# Patient Record
Sex: Female | Born: 1965 | Hispanic: Yes | Marital: Married | Smoking: Never smoker
Health system: Southern US, Community
[De-identification: ages and names within clinical notes are randomized; demographics above are authoritative.]

## PROBLEM LIST (undated history)

## (undated) DIAGNOSIS — E079 Disorder of thyroid, unspecified: Secondary | ICD-10-CM

## (undated) DIAGNOSIS — E7800 Pure hypercholesterolemia, unspecified: Secondary | ICD-10-CM

## (undated) HISTORY — DX: Pure hypercholesterolemia, unspecified: E78.00

## (undated) HISTORY — DX: Disorder of thyroid, unspecified: E07.9

## (undated) HISTORY — PX: CARDIAC VALVE REPLACEMENT: SHX585

---

## 2018-08-15 ENCOUNTER — Ambulatory Visit: Payer: Self-pay | Admitting: Emergency Medicine

## 2018-09-25 LAB — HM MAMMOGRAPHY

## 2018-09-28 ENCOUNTER — Other Ambulatory Visit: Payer: Self-pay

## 2018-09-28 ENCOUNTER — Encounter: Payer: Self-pay | Admitting: Emergency Medicine

## 2018-09-28 ENCOUNTER — Ambulatory Visit: Payer: 59 | Admitting: Emergency Medicine

## 2018-09-28 VITALS — BP 119/82 | HR 69 | Temp 97.6°F | Ht 64.75 in | Wt 179.0 lb

## 2018-09-28 DIAGNOSIS — E079 Disorder of thyroid, unspecified: Secondary | ICD-10-CM

## 2018-09-28 DIAGNOSIS — Z1239 Encounter for other screening for malignant neoplasm of breast: Secondary | ICD-10-CM | POA: Diagnosis not present

## 2018-09-28 DIAGNOSIS — M545 Low back pain, unspecified: Secondary | ICD-10-CM

## 2018-09-28 DIAGNOSIS — Z1329 Encounter for screening for other suspected endocrine disorder: Secondary | ICD-10-CM | POA: Diagnosis not present

## 2018-09-28 DIAGNOSIS — Z1211 Encounter for screening for malignant neoplasm of colon: Secondary | ICD-10-CM | POA: Diagnosis not present

## 2018-09-28 DIAGNOSIS — Z8639 Personal history of other endocrine, nutritional and metabolic disease: Secondary | ICD-10-CM

## 2018-09-28 DIAGNOSIS — Z130 Encounter for screening for diseases of the blood and blood-forming organs and certain disorders involving the immune mechanism: Secondary | ICD-10-CM

## 2018-09-28 DIAGNOSIS — Z1321 Encounter for screening for nutritional disorder: Secondary | ICD-10-CM

## 2018-09-28 DIAGNOSIS — E039 Hypothyroidism, unspecified: Secondary | ICD-10-CM | POA: Insufficient documentation

## 2018-09-28 DIAGNOSIS — Z13220 Encounter for screening for lipoid disorders: Secondary | ICD-10-CM

## 2018-09-28 DIAGNOSIS — Z13228 Encounter for screening for other metabolic disorders: Secondary | ICD-10-CM

## 2018-09-28 LAB — POCT URINALYSIS DIP (MANUAL ENTRY)
Bilirubin, UA: NEGATIVE
Glucose, UA: NEGATIVE mg/dL
Ketones, POC UA: NEGATIVE mg/dL
Leukocytes, UA: NEGATIVE
Nitrite, UA: NEGATIVE
Protein Ur, POC: NEGATIVE mg/dL
Spec Grav, UA: 1.025 (ref 1.010–1.025)
Urobilinogen, UA: 0.2 E.U./dL
pH, UA: 6.5 (ref 5.0–8.0)

## 2018-09-28 MED ORDER — ATORVASTATIN CALCIUM 40 MG PO TABS: 40.0000 mg | ORAL_TABLET | Freq: Every day | ORAL | 3 refills | Status: DC

## 2018-09-28 MED ORDER — LEVOTHYROXINE SODIUM 75 MCG PO TABS: 75.0000 ug | ORAL_TABLET | Freq: Every day | ORAL | 3 refills | Status: DC

## 2018-09-28 NOTE — Progress Notes (Signed)
Signe Tackitt 53 y.o.   Chief Complaint  Patient presents with  . Back Pain    hs been having pain in the lower back for a while , stating to get worse. Denies falls or heavy lifting.   . Medication Refill    levothyroxine and atorvastatin, has not has medication in a while due to insurance     HISTORY OF PRESENT ILLNESS: This is a 53 y.o. female here to establish care with me.  Has a history of thyroid disorder cholesterol problems.  Was on Synthroid and Lipitor but has not taken them for 2 years.  No complaints or medical concerns today. Occasionally gets right lumbar pain with radiation down the back of her right leg for several years. HPI   Prior to Admission medications   Medication Sig Start Date End Date Taking? Authorizing Provider  atorvastatin (LIPITOR) 40 MG tablet Take 40 mg by mouth daily.   Yes [provider]  levothyroxine (SYNTHROID, LEVOTHROID) 75 MCG tablet Take 75 mcg by mouth daily before breakfast.   Yes [provider]    Not on File  There are no active problems to display for this patient.   Past Medical History:  Diagnosis Date  . Thyroid disease     Past Surgical History:  Procedure Laterality Date  . CARDIAC VALVE REPLACEMENT      Social History   Socioeconomic History  . Marital status: Married    Spouse name: Not on file  . Number of children: Not on file  . Years of education: Not on file  . Highest education level: Not on file  Occupational History  . Not on file  Social Needs  . Financial resource strain: Not on file  . Food insecurity:    Worry: Not on file    Inability: Not on file  . Transportation needs:    Medical: Not on file    Non-medical: Not on file  Tobacco Use  . Smoking status: Never Smoker  . Smokeless tobacco: Never Used  Substance and Sexual Activity  . Alcohol use: Never    Frequency: Never  . Drug use: Never  . Sexual activity: Not on file  Lifestyle  . Physical activity:    Days  per week: Not on file    Minutes per session: Not on file  . Stress: Not on file  Relationships  . Social connections:    Talks on phone: Not on file    Gets together: Not on file    Attends religious service: Not on file    Active member of club or organization: Not on file    Attends meetings of clubs or organizations: Not on file    Relationship status: Not on file  . Intimate partner violence:    Fear of current or ex partner: Not on file    Emotionally abused: Not on file    Physically abused: Not on file    Forced sexual activity: Not on file  Other Topics Concern  . Not on file  Social History Narrative  . Not on file    Family History  Problem Relation Age of Onset  . Healthy Sister      Review of Systems  Constitutional: Negative.  Negative for chills and fever.  HENT: Negative.   Eyes: Negative.   Respiratory: Negative.  Negative for cough and shortness of breath.   Cardiovascular: Negative.  Negative for chest pain and palpitations.  Gastrointestinal: Negative for abdominal pain, diarrhea, nausea  and vomiting.  Genitourinary: Negative.  Negative for dysuria and hematuria.  Musculoskeletal: Positive for back pain.  Skin: Negative.  Negative for rash.  Neurological: Negative.  Negative for dizziness and headaches.  Endo/Heme/Allergies: Negative.   All other systems reviewed and are negative.   Vitals:   09/28/18 1100  BP: 119/82  Pulse: 69  Temp: 97.6 F (36.4 C)  SpO2: 97%    Physical Exam Vitals signs reviewed.  Constitutional:      Appearance: Normal appearance.  HENT:     Head: Normocephalic and atraumatic.     Mouth/Throat:     Mouth: Mucous membranes are moist.     Pharynx: Oropharynx is clear.  Eyes:     Extraocular Movements: Extraocular movements intact.     Conjunctiva/sclera: Conjunctivae normal.     Pupils: Pupils are equal, round, and reactive to light.  Neck:     Musculoskeletal: Normal range of motion and neck supple.   Cardiovascular:     Rate and Rhythm: Normal rate and regular rhythm.     Heart sounds: Normal heart sounds.  Pulmonary:     Effort: Pulmonary effort is normal.     Breath sounds: Normal breath sounds.  Musculoskeletal: Normal range of motion.     Lumbar back: Normal. She exhibits normal range of motion, no tenderness, no bony tenderness, no swelling, no pain, no spasm and normal pulse.  Skin:    General: Skin is warm and dry.  Neurological:     General: No focal deficit present.     Mental Status: She is alert and oriented to person, place, and time.  Psychiatric:        Mood and Affect: Mood normal.        Behavior: Behavior normal.      ASSESSMENT & PLAN: Maddisyn was seen today for back pain and medication refill.  Diagnoses and all orders for this visit:  Low back pain, unspecified back pain laterality, unspecified chronicity, unspecified whether sciatica present -     POCT urinalysis dipstick  Special screening for malignant neoplasms, colon -     Cologuard  Screening for breast cancer -     MM Digital Screening; Future  Screening for endocrine, nutritional, metabolic and immunity disorder -     CMP14+EGFR  Screening for thyroid disorder -     TSH  Screening for lipid disorders -     Lipid panel  Thyroid disorder -     levothyroxine (SYNTHROID, LEVOTHROID) 75 MCG tablet; Take 1 tablet (75 mcg total) by mouth daily before breakfast.  History of high cholesterol -     atorvastatin (LIPITOR) 40 MG tablet; Take 1 tablet (40 mg total) by mouth daily.   Patient Instructions       If you have lab work done today you will be contacted with your lab results within the next 2 weeks.  If you have not heard from [place] then please contact us. The fastest way to get your results is to register for My Chart.   IF you received an x-ray today, you will receive an invoice from [facility]. Please contact [facility] at [phone] with questions or  concerns regarding your invoice.   IF you received labwork today, you will receive an invoice from [facility]. Please contact [facility] at [phone] with questions or concerns regarding your invoice.   Our billing staff will not be able to assist you with questions regarding bills from these companies.  You will be contacted with the  lab results as soon as they are available. The fastest way to get your results is to activate your My Chart account. Instructions are located on the last page of this paperwork. If you have not heard from [place] regarding the results in 2 weeks, please contact this office.    [place]ace (Health Maintenance, Female) Un estilo de vida saludable y los cuidados preventivos pueden favorecer considerablemente a la salud y Musician. Pregunte a su mdico cul es el cronograma de exmenes peridicos apropiado para usted. Esta es una buena oportunidad para consultarlo sobre cmo prevenir enfermedades y [place] sano. Adems de los controles, hay muchas otras cosas que puede hacer usted mismo. Los expertos han realizado numerosas investigaciones [employer] cambios en el estilo de vida y las medidas de prevencin que, [place], lo ayudarn a mantenerse sano. Solicite a su mdico ms informacin. EL PESO Y LA DIETA Consuma una dieta saludable.  Asegrese de [employer] verduras, frutas, productos lcteos de bajo contenido de [place] y Advertising account planner.  No consuma muchos alimentos de alto contenido de grasas slidas, azcares agregados o sal.  Realice actividad fsica con regularidad. Esta es una de las prcticas ms importantes que puede hacer por su salud. ? La Delorise Shiner de los adultos deben hacer ejercicio durante al menos 121mnutos por semana. El ejercicio debe aumentar la frecuencia cardaca y pActorla transpiracin (ejercicio de i[place]. ? La mayora de los adultos tambin deben hacer ejercicios de elongacin al mLear Corporationveces a la semana. Agregue esto al su plan de ejercicio de intensidad moderada. Mantenga un peso saludable.  El ndice de masa corporal ([facility] es una medida que puede utilizarse para identificar posibles problemas de pDixmoor Proporciona una estimacin de la grasa corporal basndose en el peso y la altura. Su mdico puede ayudarle a dRadiation protection practitionerIFort Gayy a lScientist, forensico mTheatre managerun peso saludable.  Para las mujeres de 20aos o ms: ? Un ILoma Linda University Behavioral Medicine Centermenor de 18,5 se considera bajo peso. ? Un I[facility] Incentre 18,5 y 24,9 es normal. ? Un ISurgery Center Of Sanduskyentre 25 y 29,9 se considera sobrepeso. ? Un IMC de 30 o ms se considera obesidad. Observe los niveles de colesterol y lpidos en la sangre.  Debe comenzar a rEnglish as a second language teacherde lpidos y cResearch officer, trade unionen la sangre a los 20aos y luego repetirlos cada 538aos  Es posible que nAutomotive engineerlos niveles de colesterol con mayor frecuencia si: ? Sus niveles de lpidos y colesterol son altos. ? Es mayor de 535TDD ? Presenta un alto riesgo de padecer enfermedades cardacas. DETECCIN DE CNCER Cncer de pulmn  Se recomienda realizar exmenes de deteccin de cncer de pulmn a personas adultas entre 557y 852aos que estn en riesgo de dHorticulturist, commercialde pulmn por sus antecedentes de consumo de tabaco.  Se recomienda una tomografa computarizada de baja dosis de los pulmones todos los aos a las personas que: ? Fuman actualmente. ? Hayan dejado el hbito en algn momento en los ltimos 15aos. ? Hayan fumado durante 30aos un paquete diario. Un paquete-ao equivale a fumar un promedio de un paquete de cigarrillos diario durante un ao.  Los exmenes de deteccin anuales deben continuar hasta que hayan pasado 15aos desde que dej de fumar.  Ya no debern realizarse si tiene un problema de salud que le impida recibir tratamiento para eScience writerde pulmn. Cncer de mama  Practique la autoconciencia de la mama. Esto significa reconocer la apariencia normal de sus  mamas  y [place].  Tambin significa realizar autoexmenes regulares de [employer]. Informe a su mdico sobre cualquier cambio, sin importar cun pequeo sea.  Si tiene entre 20 y 13 aos, un mdico debe realizarle un examen clnico de las mamas como parte del examen regular de [place], cada 1 a 3aos.  Si tiene 40aos o ms, debe Information systems manager clnico de las Microsoft. Tambin considere realizarse una [place] ([place]) todos los [place].  Si tiene antecedentes familiares de cncer de mama, hable con su mdico para someterse a un estudio gentico.  Si tiene alto riesgo de Chief Financial Officer de mama, hable con su mdico para someterse a Public house manager y [employer].  La evaluacin del gen del cncer de mama (BRCA) se recomienda a mujeres que tengan familiares con cnceres relacionados con el BRCA. Los cnceres relacionados con el BRCA incluyen los siguientes: ? [place]. ? Ovario. ? Trompas. ? Cnceres de peritoneo.  Los resultados de la evaluacin determinarn la necesidad de asesoramiento gentico y de Valentine de BRCA1 y BRCA2. Cncer de cuello del tero El mdico puede recomendarle que se haga pruebas peridicas de deteccin de cncer de los rganos de la pelvis (ovarios, tero y vagina). Estas pruebas incluyen un examen plvico, que abarca controlar si se produjeron cambios microscpicos en la superficie del cuello del tero (prueba de Papanicolaou). Pueden recomendarle que se haga estas pruebas cada 3aos, a partir de los 21aos.  A las mujeres que tienen entre 30 y 41aos, los mdicos pueden recomendarles que se sometan a exmenes plvicos y pruebas de Papanicolaou cada 70aos, o a la prueba de Papanicolaou y el examen plvico en combinacin con estudios de deteccin del virus del papiloma humano (VPH) cada 5aos. Algunos tipos de VPH aumentan el riesgo de Chief Financial Officer de cuello del tero. La prueba para la deteccin del  VPH tambin puede realizarse a mujeres de cualquier edad cuyos resultados de la prueba de Papanicolaou no sean claros.  Es posible que otros mdicos no recomienden exmenes de deteccin a mujeres no embarazadas que se consideran sujetos de bajo riesgo de Chief Financial Officer de pelvis y que no tienen sntomas. Pregntele al mdico si un examen plvico de deteccin es adecuado para usted.  Si ha recibido un tratamiento para Science writer cervical o una enfermedad que podra causar cncer, necesitar realizarse una prueba de Papanicolaou y controles durante al menos 24 aos de concluido el [place]. Si no se ha hecho el Papanicolaou con regularidad, debern volver a evaluarse los factores de riesgo (como tener un nuevo compaero sexual), para Teacher, adult education si debe realizarse los estudios nuevamente. Algunas mujeres sufren problemas mdicos que aumentan la probabilidad de Museum/gallery curator cncer de cuello del tero. En estos casos, el mdico podr QUALCOMM se realicen controles y pruebas de Papanicolaou con ms frecuencia. Cncer colorrectal  Este tipo de cncer puede detectarse y a menudo prevenirse.  Por lo general, los estudios de rutina se deben Medical laboratory scientific officer a Field seismologist a Proofreader de los 4 aos y [place] 61 aos.  Sin embargo, el mdico podr aconsejarle que lo haga antes, si tiene factores de riesgo para el cncer de colon.  Tambin puede recomendarle que use un kit de prueba para Hydrologist en la materia fecal.  Es posible que se use una pequea cmara en el extremo de un tubo para examinar directamente el colon (sigmoidoscopia o colonoscopia) a fin de Hydrographic surveyor formas tempranas de cncer colorrectal.  Los exmenes  de rutina generalmente comienzan a los 50aos.  El examen directo del colon se debe repetir cada 5 a 10aos hasta los 75aos. Sin embargo, es posible que se realicen exmenes con mayor frecuencia, si se detectan formas tempranas de plipos precancerosos o pequeos bultos. Cncer de  piel  Revise la piel de la cabeza a los pies con regularidad.  Informe a su mdico si aparecen nuevos lunares o los que tiene se modifican, especialmente en su forma y color.  Tambin notifique al mdico si tiene un lunar que es ms grande que el tamao de una goma de lpiz.  Siempre use pantalla solar. Aplique pantalla solar de Kerry Dory y repetida a lo largo del Training and development officer.  Protjase usando mangas y [employer], un sombrero de ala ancha y gafas para el sol, siempre que se encuentre en el exterior. ENFERMEDADES CARDACAS, DIABETES E HIPERTENSIN ARTERIAL  La hipertensin arterial causa enfermedades cardacas y [place] el riesgo de ictus. La hipertensin arterial es ms probable en los siguientes casos: ? Las personas que tienen la presin arterial en el extremo del rango normal (100-139/85-89 mm Hg). ? [employer] con sobrepeso u obesidad. ? Scientist, water quality.  Si usted tiene entre 18 y 39 aos, debe medirse la presin arterial cada 3 a 5 aos. Si usted tiene 40 aos o ms, debe medirse la presin arterial [employer]lett[employer]ard. Debe medirse la presin arterial dos veces: una vez cuando est en un hospital o una clnica y la otra vez cuando est en otro sitio. Registre el promedio de [employer]. Para controlar su presin arterial cuando no est en un hospital o [place], puede usar lo siguiente: ? Jorje Guild automtica para medir la presin arterial en una farmacia. ? Un monitor para medir la presin arterial en el hogar.  Si tiene entre 78 y 62 aos, consulte a su mdico si debe tomar aspirina para prevenir el ictus.  Realcese exmenes de deteccin de la diabetes con regularidad. Esto incluye la toma de [place] de sangre para controlar el nivel de azcar en la sangre durante el [place]. ? Si tiene un peso normal y un bajo riesgo de padecer diabetes, realcese este anlisis cada tres aos despus de los 45aos. ? Si tiene sobrepeso y un alto riesgo de padecer  diabetes, considere someterse a este anlisis antes o con mayor frecuencia. PREVENCIN DE INFECCIONES HepatitisB  Si tiene un riesgo ms alto de Museum/gallery curator hepatitis B, debe someterse a un examen de deteccin de este virus. Se considera que tiene un alto riesgo de contraer hepatitis B si: ? Naci en un pas donde la hepatitis B es frecuente. Pregntele a su mdico qu pases son considerados de Public affairs consultant. ? Sus padres nacieron en un pas de alto riesgo y usted no recibi una vacuna que lo proteja contra la hepatitis B (vacuna contra la hepatitis B). ? [place]. ? [place] agujas para inyectarse drogas. ? Vive con alguien que tiene hepatitis B. ? Ha tenido sexo con alguien que tiene hepatitis B. ? Recibe tratamiento de hemodilisis. ? Toma ciertos medicamentos para el cncer, trasplante de rganos y afecciones autoinmunitarias. Hepatitis C  Se recomienda un anlisis de [place] para: ? [employer] 1945 y 1965. ? Todas las personas que tengan un riesgo de haber contrado hepatitis C. Enfermedades de transmisin sexual (ETS).  Debe realizarse pruebas de deteccin de enfermedades de transmisin sexual (ETS), incluidas gonorrea y clamidia si: ? Es sexualmente [place] y es  menor de 24aos. ? Es mayor de 24aos, y Investment banker, operational informa que corre riesgo de tener este tipo de infecciones. ? La actividad sexual ha cambiado desde que le hicieron la ltima prueba de deteccin y tiene un riesgo mayor de Best boy clamidia o Radio broadcast assistant. Pregntele al mdico si usted tiene riesgo.  Si no tiene el VIH, pero corre riesgo de infectarse por el virus, se recomienda tomar diariamente un medicamento recetado para evitar la infeccin. Esto se conoce como profilaxis previa a la exposicin. Se considera que est en riesgo si: ? Es [place] sexualmente y no [place] preservativos habitualmente o no conoce el estado del VIH de sus Advertising copywriter. ? Se inyecta drogas. ? Es [place] sexualmente con Ardelia Mems pareja  que tiene VIH. Consulte a su mdico para saber si tiene un alto riesgo de infectarse por el VIH. Si opta por comenzar la profilaxis previa a la exposicin, primero debe realizarse anlisis de deteccin del VIH. Luego, le harn anlisis cada 28mses mientras est tomando los medicamentos para la profilaxis previa a la exposicin. [facility] Si es premenopusica y puede quedar eHarvey Cedars solicite a su mdico asesoramiento previo a la concepcin.  Si puede quedar embarazada, tome 400 a [id number]([id number]) de cido fAnheuser-Busch  Si desea evitar el embarazo, hable con su mdico sobre el control de la natalidad (anticoncepcin). OSTEOPOROSIS Y MENOPAUSIA  La osteoporosis es una enfermedad en la que los huesos pierden los minerales y la fuerza por el avance de la edad. El resultado pueden ser fracturas graves en los hThunder Mountain El riesgo de osteoporosis puede identificarse con uArdelia Memsprueba de densidad sea.  Si tiene 65aos o ms, o si est en riesgo de sufrir osteoporosis y fracturas, pregunte a su mdico si debe someterse a exmenes.  Consulte a su mdico si debe tomar un suplemento de calcio o de vitamina D para reducir el riesgo de osteoporosis.  La menopausia puede presentar ciertos sntomas fsicos y rGaffer  La terapia de reemplazo hormonal puede reducir algunos de estos sntomas y rGaffer Consulte a su mdico para saber si la terapia de reemplazo hormonal es conveniente para usted. INSTRUCCIONES PARA EL CUIDADO EN EL HOGAR  Realcese los estudios de rutina de la salud, dentales y de lPublic librarian  [place]  No consuma ningn producto que contenga tabaco, lo que incluye cigarrillos, tabaco de mHigher education careers advisero cPsychologist, sport and exercise  Si est embarazada, no beba alcohol.  Si est amamantando, reduzca el consumo de alcohol y la frecuencia con la que consume.  Si es mujer y no est embarazada limite el consumo de alcohol a no ms de 1 medida por da. Una medida  equivale a 12onzas de cerveza, 5onzas de vino o 1onzas de bebidas alcohlicas de alta graduacin.  No consuma drogas.  No comparta agujas.  Solicite ayuda a su mdico si necesita apoyo o informacin para abandonar las drogas.  Informe a su mdico si a menudo se siente deprimido.  Notifique a su mdico si alguna vez ha sido vctima de abuso o si no se siente seguro en su hogar. Esta informacin no tiene cMarine scientistel consejo del mdico. Asegrese de hacerle al mdico cualquier pregunta que tenga. Document Released: 06/25/2011 Document Revised: 07/27/2014 Document Reviewed: 04/09/2015 Elsevier Interactive Patient Education  2019 Elsevier Inc.      MAgustina Caroli MD [facility]

## 2018-09-28 NOTE — Patient Instructions (Addendum)
If you have lab work done today you will be contacted with your lab results within the next 2 weeks.  If you have not heard from [place] then please contact us. The fastest way to get your results is to register for My Chart.   IF you received an x-ray today, you will receive an invoice from [facility]. Please contact [facility] at [phone] with questions or concerns regarding your invoice.   IF you received labwork today, you will receive an invoice from [place]. Please contact [facility] at [phone] with questions or concerns regarding your invoice.   Our billing staff will not be able to assist you with questions regarding bills from these companies.  You will be contacted with the lab results as soon as they are available. The fastest way to get your results is to activate your My Chart account. Instructions are located on the last page of this paperwork. If you have not heard from [place] regarding the results in 2 weeks, please contact this office.    Tracey Campbell (Health Maintenance, Female) Un estilo de vida saludable y los cuidados preventivos pueden favorecer considerablemente a la salud y Musician. Pregunte a su mdico cul es el cronograma de exmenes peridicos apropiado para usted. Esta es una buena oportunidad para consultarlo sobre cmo prevenir enfermedades y [place] sano. Adems de los controles, hay muchas otras cosas que puede hacer usted mismo. Los expertos han realizado numerosas investigaciones [employer] cambios en el estilo de vida y las medidas de prevencin que, [place], lo ayudarn a mantenerse sano. Solicite a su mdico ms informacin. EL PESO Y LA DIETA Consuma una dieta saludable.  Asegrese de [employer] verduras, frutas, productos lcteos de bajo contenido de [place] y Advertising account planner.  No consuma muchos alimentos de alto contenido de grasas slidas, azcares agregados o sal.  Realice actividad  fsica con regularidad. Esta es una de las prcticas ms importantes que puede hacer por su salud. ? La Delorise Shiner de los adultos deben hacer ejercicio durante al menos 181mnutos por semana. El ejercicio debe aumentar la frecuencia cardaca y pActorla transpiracin (ejercicio de iDundee. ? La mayora de los adultos tambin deben hacer ejercicios de elongacin al mToysRusveces a la semana. Agregue esto al su plan de ejercicio de intensidad moderada. Mantenga un peso saludable.  El ndice de masa corporal ([facility] es una medida que puede utilizarse para identificar posibles problemas de pAlligator Proporciona una estimacin de la grasa corporal basndose en el peso y la altura. Su mdico puede ayudarle a dRadiation protection practitionerILa [place] a lScientist, forensico mTheatre managerun peso saludable.  Para las mujeres de 20aos o ms: ? Un IY[facility] G[facility]ology And Assocmenor de 18,5 se considera bajo peso. ? Un IC[facility] Cent[facility] Paentre 18,5 y 24,9 es normal. ? Un IVa [facility] Healthcare Systementre 25 y 29,9 se considera sobrepeso. ? Un IMC de 30 o ms se considera obesidad. Observe los niveles de colesterol y lpidos en la sangre.  Debe comenzar a rEnglish as a second language teacherde lpidos y cResearch officer, trade unionen la sangre a los 20aos y luego repetirlos cada 522aos  Es posible que nAutomotive engineerlos niveles de colesterol con mayor frecuencia si: ? Sus niveles de lpidos y colesterol son altos. ? Es mayor de [id number] ? Presenta un alto riesgo de padecer enfermedades cardacas. DETECCIN DE CNCER Cncer de pulmn  Se recomienda realizar exmenes de deteccin de cncer de pulmn a personas adultas entre 59y 855aos que estn en riesgo  de Horticulturist, commercial de pulmn por sus antecedentes de consumo de tabaco.  Se recomienda una tomografa computarizada de baja dosis de los [employer] aos a las personas que: ? Fuman actualmente. ? Hayan dejado el hbito en algn momento en los ltimos 15aos. ? Hayan fumado durante 30aos un paquete diario. Un paquete-ao equivale a fumar un promedio de  un paquete de cigarrillos diario durante un ao.  Los exmenes de deteccin anuales deben continuar hasta que hayan pasado 15aos desde que dej de fumar.  Ya no debern realizarse si tiene un problema de salud que le impida recibir tratamiento para Science writer de pulmn. Cncer de mama  Practique la autoconciencia de la mama. Esto significa reconocer la apariencia normal de sus mamas y cmo las siente.  Tambin significa realizar autoexmenes regulares de [employer]. Informe a su mdico sobre cualquier cambio, sin importar cun pequeo sea.  Si tiene entre 20 y 67 aos, un mdico debe realizarle un examen clnico de las mamas como parte del examen regular de [place], cada 1 a 3aos.  Si tiene 40aos o ms, debe Information systems manager clnico de las Microsoft. Tambin considere realizarse una [place] todos los [place].  Si tiene antecedentes familiares de cncer de mama, hable con su mdico para someterse a un estudio gentico.  Si tiene alto riesgo de Chief Financial Officer de mama, hable con su mdico para someterse a Public house manager y [employer].  La evaluacin del gen del cncer de mama (BRCA) se recomienda a mujeres que tengan familiares con cnceres relacionados con el BRCA. Los cnceres relacionados con el BRCA incluyen los siguientes: ? [place]. ? Ovario. ? [place]. ? Cnceres de peritoneo.  Los resultados de la evaluacin determinarn la necesidad de asesoramiento gentico y de [place] de BRCA1 y BRCA2. Cncer de cuello del tero El mdico puede recomendarle que se haga pruebas peridicas de deteccin de cncer de los rganos de la pelvis (ovarios, tero y vagina). Estas pruebas incluyen un examen plvico, que abarca controlar si se produjeron cambios microscpicos en la superficie del cuello del tero (prueba de Papanicolaou). Pueden recomendarle que se haga estas pruebas cada 3aos, a partir de los 21aos.  A las mujeres  que tienen entre 30 y 59aos, los mdicos pueden recomendarles que se sometan a exmenes plvicos y pruebas de Papanicolaou cada 58aos, o a la prueba de Papanicolaou y el examen plvico en combinacin con estudios de deteccin del virus del papiloma humano (VPH) cada 5aos. Algunos tipos de VPH aumentan el riesgo de Chief Financial Officer de cuello del tero. La prueba para la deteccin del VPH tambin puede realizarse a mujeres de cualquier edad cuyos resultados de la prueba de Papanicolaou no sean claros.  Es posible que otros mdicos no recomienden exmenes de deteccin a mujeres no embarazadas que se consideran sujetos de bajo riesgo de Chief Financial Officer de pelvis y que no tienen sntomas. Pregntele al mdico si un examen plvico de deteccin es adecuado para usted.  Si ha recibido un tratamiento para Science writer cervical o una enfermedad que podra causar cncer, necesitar realizarse una prueba de Papanicolaou y controles durante al menos 25 aos de concluido el [place]. Si no se ha hecho el Papanicolaou con regularidad, debern volver a evaluarse los factores de riesgo (como tener un nuevo compaero sexual), para Teacher, adult education si debe realizarse los estudios nuevamente. Algunas mujeres sufren problemas mdicos que aumentan la probabilidad de Museum/gallery curator cncer de cuello del tero.  En estos casos, el mdico podr QUALCOMM se realicen controles y pruebas de Papanicolaou con ms frecuencia. Cncer colorrectal  Este tipo de cncer puede detectarse y a menudo prevenirse.  Por lo general, los estudios de rutina se deben Medical laboratory scientific officer a Field seismologist a Proofreader de los 54 aos y [place] 2 aos.  Sin embargo, el mdico podr aconsejarle que lo haga antes, si tiene factores de riesgo para el cncer de colon.  Tambin puede recomendarle que use un kit de prueba para Hydrologist en la materia fecal.  Es posible que se use una pequea cmara en el extremo de un tubo para examinar directamente el colon (sigmoidoscopia  o colonoscopia) a fin de Hydrographic surveyor formas tempranas de cncer colorrectal.  Los exmenes de rutina generalmente comienzan a los 50aos.  El examen directo del colon se debe repetir cada 5 a 10aos hasta los 75aos. Sin embargo, es posible que se realicen exmenes con mayor frecuencia, si se detectan formas tempranas de plipos precancerosos o pequeos bultos. Cncer de piel  Revise la piel de la cabeza a los pies con regularidad.  Informe a su mdico si aparecen nuevos lunares o los que tiene se modifican, especialmente en su forma y color.  Tambin notifique al mdico si tiene un lunar que es ms grande que el tamao de una goma de lpiz.  Siempre use pantalla solar. Aplique pantalla solar de [place] y repetida a lo largo del Training and development officer.  Protjase usando mangas y [employer], un sombrero de ala ancha y gafas para el sol, siempre que se encuentre en el exterior. ENFERMEDADES CARDACAS, DIABETES E HIPERTENSIN ARTERIAL  La hipertensin arterial causa enfermedades cardacas y [place] el riesgo de ictus. La hipertensin arterial es ms probable en los siguientes casos: ? Las personas que tienen la presin arterial en el extremo del rango normal (100-139/85-89 mm Hg). ? [employer] con sobrepeso u obesidad. ? Scientist, water quality.  Si usted tiene entre 18 y 39 aos, debe medirse la presin arterial cada 3 a 5 aos. Si usted tiene 40 aos o ms, debe medirse la presin arterial Hewlett[employer]ard. Debe medirse la presin arterial dos veces: una vez cuando est en un hospital o una clnica y la otra vez cuando est en otro sitio. Registre el promedio de [employer]. Para controlar su presin arterial cuando no est en un hospital o [place], puede usar lo siguiente: ? Jorje Guild automtica para medir la presin arterial en una farmacia. ? Un monitor para medir la presin arterial en el hogar.  Si tiene entre 6 y 86 aos, consulte a su mdico si debe tomar aspirina para  prevenir el ictus.  Realcese exmenes de deteccin de la diabetes con regularidad. Esto incluye la toma de [place] de sangre para controlar el nivel de azcar en la sangre durante el [place]. ? Si tiene un peso normal y un bajo riesgo de padecer diabetes, realcese este anlisis cada tres aos despus de los 45aos. ? Si tiene sobrepeso y un alto riesgo de padecer diabetes, considere someterse a este anlisis antes o con mayor frecuencia. PREVENCIN DE INFECCIONES HepatitisB  Si tiene un riesgo ms alto de Museum/gallery curator hepatitis B, debe someterse a un examen de deteccin de este virus. Se considera que tiene un alto riesgo de contraer hepatitis B si: ? Naci en un pas donde la hepatitis B es frecuente. Pregntele a su mdico qu pases son considerados de Public affairs consultant. ? Sus padres Sports coach  pas de alto riesgo y usted no recibi una vacuna que lo proteja contra la hepatitis B (vacuna contra la hepatitis B). ? [place]. ? [place] agujas para inyectarse drogas. ? Vive con alguien que tiene hepatitis B. ? Ha tenido sexo con alguien que tiene hepatitis B. ? Recibe tratamiento de hemodilisis. ? Toma ciertos medicamentos para el cncer, trasplante de rganos y afecciones autoinmunitarias. Hepatitis C  Se recomienda un anlisis de [place] para: ? [employer] 1945 y 1965. ? Todas las personas que tengan un riesgo de haber contrado hepatitis C. Enfermedades de transmisin sexual (ETS).  Debe realizarse pruebas de deteccin de enfermedades de transmisin sexual (ETS), incluidas gonorrea y clamidia si: ? Es sexualmente activo y es menor de [id number]. ? Es mayor de 24aos, y Investment banker, operational informa que corre riesgo de tener este tipo de infecciones. ? La actividad sexual ha cambiado desde que le hicieron la ltima prueba de deteccin y tiene un riesgo mayor de Best boy clamidia o Radio broadcast assistant. Pregntele al mdico si usted tiene riesgo.  Si no tiene el VIH, pero corre riesgo de  infectarse por el virus, se recomienda tomar diariamente un medicamento recetado para evitar la infeccin. Esto se conoce como profilaxis previa a la exposicin. Se considera que est en riesgo si: ? Es [place] sexualmente y no [place] preservativos habitualmente o no conoce el estado del VIH de sus Advertising copywriter. ? Se inyecta drogas. ? Es [place] sexualmente con Ardelia Mems pareja que tiene VIH. Consulte a su mdico para saber si tiene un alto riesgo de infectarse por el VIH. Si opta por comenzar la profilaxis previa a la exposicin, primero debe realizarse anlisis de deteccin del VIH. Luego, le harn anlisis cada 59mses mientras est tomando los medicamentos para la profilaxis previa a la exposicin. ERimrock Foundation Si es premenopusica y puede quedar e[place]ronett solicite a su mdico asesoramiento previo a la concepcin.  Si puede quedar embarazada, tome 400 a [id number](mcg) de cido fAnheuser-Busch  Si desea evitar el embarazo, hable con su mdico sobre el control de la natalidad (anticoncepcin). OSTEOPOROSIS Y MENOPAUSIA  La osteoporosis es una enfermedad en la que los huesos pierden los minerales y la fuerza por el avance de la edad. El resultado pueden ser fracturas graves en los hRidgemark El riesgo de osteoporosis puede identificarse con uArdelia Memsprueba de densidad sea.  Si tiene 65aos o ms, o si est en riesgo de sufrir osteoporosis y fracturas, pregunte a su mdico si debe someterse a exmenes.  Consulte a su mdico si debe tomar un suplemento de calcio o de vitamina D para reducir el riesgo de osteoporosis.  La menopausia puede presentar ciertos sntomas fsicos y rGaffer  La terapia de reemplazo hormonal puede reducir algunos de estos sntomas y rGaffer Consulte a su mdico para saber si la terapia de reemplazo hormonal es conveniente para usted. INSTRUCCIONES PARA EL CUIDADO EN EL HOGAR  Realcese los estudios de rutina de la salud, dentales y de lPublic librarian  [place]  No consuma ningn producto que contenga tabaco, lo que incluye cigarrillos, tabaco de mHigher education careers advisero cPsychologist, sport and exercise  Si est embarazada, no beba alcohol.  Si est amamantando, reduzca el consumo de alcohol y la frecuencia con la que consume.  Si es mujer y no est embarazada limite el consumo de alcohol a no ms de 1 medida por da. Una medida equivale a 12onzas de cerveza, 5onzas de vino o 1onzas de  bebidas alcohlicas de alta graduacin.  No consuma drogas.  No comparta agujas.  Solicite ayuda a su mdico si necesita apoyo o informacin para abandonar las drogas.  Informe a su mdico si a menudo se siente deprimido.  Notifique a su mdico si alguna vez ha sido vctima de abuso o si no se siente seguro en su hogar. Esta informacin no tiene Marine scientist el consejo del mdico. Asegrese de hacerle al mdico cualquier pregunta que tenga. Document Released: 06/25/2011 Document Revised: 07/27/2014 Document Reviewed: 04/09/2015 Elsevier Interactive Patient Education  2019 [employer].

## 2018-09-29 ENCOUNTER — Other Ambulatory Visit: Payer: Self-pay | Admitting: Emergency Medicine

## 2018-09-29 LAB — CMP14+EGFR
A/G RATIO: 1.6 (ref 1.2–2.2)
ALT: 30 IU/L (ref 0–32)
AST: 29 IU/L (ref 0–40)
Albumin: 4.7 g/dL (ref 3.8–4.9)
Alkaline Phosphatase: 64 IU/L (ref 39–117)
BUN / CREAT RATIO: 15 (ref 9–23)
BUN: 11 mg/dL (ref 6–24)
Bilirubin Total: 0.4 mg/dL (ref 0.0–1.2)
CO2: 19 mmol/L — ABNORMAL LOW (ref 20–29)
Calcium: 9.7 mg/dL (ref 8.7–10.2)
Chloride: 103 mmol/L (ref 96–106)
Creatinine, Ser: 0.71 mg/dL (ref 0.57–1.00)
GFR calc Af Amer: 112 mL/min/{1.73_m2} (ref >59)
GFR calc non Af Amer: 98 mL/min/{1.73_m2} (ref >59)
Globulin, Total: 3 g/dL (ref 1.5–4.5)
Glucose: 91 mg/dL (ref 65–99)
POTASSIUM: 4.4 mmol/L (ref 3.5–5.2)
Sodium: 140 mmol/L (ref 134–144)
Total Protein: 7.7 g/dL (ref 6.0–8.5)

## 2018-09-29 LAB — LIPID PANEL
CHOLESTEROL TOTAL: 286 mg/dL — AB (ref 100–199)
Chol/HDL Ratio: 5.4 ratio — ABNORMAL HIGH (ref 0.0–4.4)
HDL: 53 mg/dL (ref >39)
LDL Calculated: 194 mg/dL — ABNORMAL HIGH (ref 0–99)
Triglycerides: 193 mg/dL — ABNORMAL HIGH (ref 0–149)
VLDL Cholesterol Cal: 39 mg/dL (ref 5–40)

## 2018-09-29 LAB — TSH: TSH: 9.02 u[IU]/mL — ABNORMAL HIGH (ref 0.450–4.500)

## 2018-11-10 ENCOUNTER — Encounter: Payer: Self-pay | Admitting: *Deleted

## 2018-12-01 ENCOUNTER — Other Ambulatory Visit: Payer: Self-pay

## 2018-12-01 ENCOUNTER — Other Ambulatory Visit: Payer: Self-pay | Admitting: *Deleted

## 2018-12-01 ENCOUNTER — Encounter: Payer: Self-pay | Admitting: Emergency Medicine

## 2018-12-01 ENCOUNTER — Telehealth (INDEPENDENT_AMBULATORY_CARE_PROVIDER_SITE_OTHER): Payer: 59 | Admitting: Emergency Medicine

## 2018-12-01 DIAGNOSIS — R05 Cough: Secondary | ICD-10-CM

## 2018-12-01 DIAGNOSIS — Z7189 Other specified counseling: Secondary | ICD-10-CM | POA: Diagnosis not present

## 2018-12-01 DIAGNOSIS — Z124 Encounter for screening for malignant neoplasm of cervix: Secondary | ICD-10-CM

## 2018-12-01 DIAGNOSIS — R059 Cough, unspecified: Secondary | ICD-10-CM

## 2018-12-01 DIAGNOSIS — Z1211 Encounter for screening for malignant neoplasm of colon: Secondary | ICD-10-CM

## 2018-12-01 NOTE — Progress Notes (Signed)
Telemedicine Encounter- SOAP NOTE Established Patient  This telephone encounter was conducted with the patient's (or proxy's) verbal consent via audio telecommunications: yes/no: Yes Patient was instructed to have this encounter in a suitably private space; and to only have persons present to whom they give permission to participate. In addition, patient identity was confirmed by use of name plus two identifiers (DOB and address).  I discussed the limitations, risks, security and privacy concerns of performing an evaluation and management service by telephone and the availability of in person appointments. I also discussed with the patient that there may be a patient responsible charge related to this service. The patient expressed understanding and agreed to proceed.  I spent a total of TIME; 0 MIN TO 60 MIN: 10 minutes talking with the patient or their proxy.  No chief complaint on file. Dry cough  Subjective   Tracey Campbell is a 53 y.o. female established patient. Telephone visit today for evaluation of dry cough that started 3 days ago.  Denies flulike symptoms.  No exposure to known coronavirus patient.  No history of asthma or COPD.  Non-smoker.  Cough is nonproductive and intermittent.  Gets worse when exposed to dry hot air.  Needs work note. Denies any other associated symptoms.  Does not feel ill.  Otherwise asymptomatic.  HPI   Patient Active Problem List   Diagnosis Date Noted  . Low back pain 09/28/2018  . History of high cholesterol 09/28/2018  . Thyroid disorder 09/28/2018    Past Medical History:  Diagnosis Date  . Thyroid disease     Current Outpatient Medications  Medication Sig Dispense Refill  . atorvastatin (LIPITOR) 40 MG tablet Take 1 tablet (40 mg total) by mouth daily. 90 tablet 3  . levothyroxine (SYNTHROID, LEVOTHROID) 75 MCG tablet Take 1 tablet (75 mcg total) by mouth daily before breakfast. 90 tablet 3  . Omega-3 Fatty Acids (FISH OIL) 1000 MG  CPDR Take by mouth daily.     No current facility-administered medications for this visit.     No Known Allergies  Social History   Socioeconomic History  . Marital status: Married    Spouse name: Not on file  . Number of children: Not on file  . Years of education: Not on file  . Highest education level: Not on file  Occupational History  . Not on file  Social Needs  . Financial resource strain: Not on file  . Food insecurity:    Worry: Not on file    Inability: Not on file  . Transportation needs:    Medical: Not on file    Non-medical: Not on file  Tobacco Use  . Smoking status: Never Smoker  . Smokeless tobacco: Never Used  Substance and Sexual Activity  . Alcohol use: Never    Frequency: Never  . Drug use: Never  . Sexual activity: Not on file  Lifestyle  . Physical activity:    Days per week: Not on file    Minutes per session: Not on file  . Stress: Not on file  Relationships  . Social connections:    Talks on phone: Not on file    Gets together: Not on file    Attends religious service: Not on file    Active member of club or organization: Not on file    Attends meetings of clubs or organizations: Not on file    Relationship status: Not on file  . Intimate partner violence:    Fear  of current or ex partner: Not on file    Emotionally abused: Not on file    Physically abused: Not on file    Forced sexual activity: Not on file  Other Topics Concern  . Not on file  Social History Narrative  . Not on file    Review of Systems  Constitutional: Negative.  Negative for chills, fever and malaise/fatigue.  HENT: Negative for congestion, nosebleeds, sinus pain and sore throat.   Eyes: Negative.  Negative for blurred vision and double vision.  Respiratory: Positive for cough. Negative for hemoptysis, sputum production, shortness of breath and wheezing.   Cardiovascular: Negative.  Negative for chest pain and palpitations.  Gastrointestinal: Negative.   Negative for abdominal pain, blood in stool, diarrhea, melena, nausea and vomiting.  Genitourinary: Negative for dysuria and hematuria.  Skin: Negative.  Negative for rash.  Neurological: Negative for dizziness and headaches.  Endo/Heme/Allergies: Negative.   All other systems reviewed and are negative.   Objective   Vitals as reported by the patient: None available Awake and oriented x3 in no apparent respiratory distress.  No coughing while talking to me on the phone. There were no vitals filed for this visit.  There are no diagnoses linked to this encounter.  Diagnoses and all orders for this visit:  Cough  Advice Given About Covid-19 Virus by Telephone    Clinically stable.  No medical concerns identified during this visit. No new medications indicated. Able to go back to work without restrictions. Work note dictated and printed.  I discussed the assessment and treatment plan with the patient. The patient was provided an opportunity to ask questions and all were answered. The patient agreed with the plan and demonstrated an understanding of the instructions.   The patient was advised to call back or seek an in-person evaluation if the symptoms worsen or if the condition fails to improve as anticipated.  I provided 10 minutes of non-face-to-face time during this encounter.  Georgina Quint, MD  Primary Care at [facility]

## 2018-12-01 NOTE — Progress Notes (Signed)
Contacted patient to triage for appointment. Patient is complaining of a dry cough for 3 days. 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

## 2018-12-23 ENCOUNTER — Other Ambulatory Visit: Payer: Self-pay | Admitting: Emergency Medicine

## 2018-12-23 ENCOUNTER — Ambulatory Visit
Admission: RE | Admit: 2018-12-23 | Discharge: 2018-12-23 | Disposition: A | Discharge status: Home / Self Care | Payer: 59 | Source: Ambulatory Visit | Attending: Emergency Medicine | Admitting: Emergency Medicine

## 2018-12-23 ENCOUNTER — Other Ambulatory Visit: Payer: Self-pay

## 2018-12-23 DIAGNOSIS — Z1239 Encounter for other screening for malignant neoplasm of breast: Secondary | ICD-10-CM

## 2019-01-18 ENCOUNTER — Ambulatory Visit: Payer: 59 | Admitting: Emergency Medicine

## 2019-01-24 ENCOUNTER — Ambulatory Visit: Payer: 59 | Admitting: Emergency Medicine

## 2019-01-26 ENCOUNTER — Encounter: Payer: Self-pay | Admitting: Emergency Medicine

## 2019-01-27 ENCOUNTER — Ambulatory Visit (INDEPENDENT_AMBULATORY_CARE_PROVIDER_SITE_OTHER): Payer: 59 | Admitting: Family Medicine

## 2019-01-27 ENCOUNTER — Encounter: Payer: Self-pay | Admitting: Family Medicine

## 2019-01-27 ENCOUNTER — Other Ambulatory Visit: Payer: Self-pay

## 2019-01-27 VITALS — BP 114/75 | HR 79 | Temp 98.2°F | Ht 64.75 in | Wt 185.0 lb

## 2019-01-27 DIAGNOSIS — M5431 Sciatica, right side: Secondary | ICD-10-CM

## 2019-01-27 DIAGNOSIS — E039 Hypothyroidism, unspecified: Secondary | ICD-10-CM | POA: Diagnosis not present

## 2019-01-27 DIAGNOSIS — E785 Hyperlipidemia, unspecified: Secondary | ICD-10-CM

## 2019-01-27 DIAGNOSIS — M26622 Arthralgia of left temporomandibular joint: Secondary | ICD-10-CM | POA: Diagnosis not present

## 2019-01-27 MED ORDER — METHYLPREDNISOLONE 4 MG PO TBPK: ORAL_TABLET | ORAL | 0 refills | Status: DC

## 2019-01-27 MED ORDER — GABAPENTIN 300 MG PO CAPS: 300.0000 mg | ORAL_CAPSULE | Freq: Three times a day (TID) | ORAL | 0 refills | Status: DC

## 2019-01-27 NOTE — Progress Notes (Signed)
7/10/20206:09 PM  Tracey Campbell March 12, 1966, 53 y.o., female [id number]  Chief Complaint  Patient presents with  . Pain    in left ear both feet due to standing while working and leg has right leg has a knot, treatment with hot cream and advil    HPI:   Patient is a 53 y.o. 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, standing all day About 2 weeks ago started having right leg pain, feels tight, tingling, with spasms of back upto mid back Denies saddle anesthesia, loss of control of bowel or bladder Denies falls  Left ear pain x 1 week Denies hearing loss, ringing Denies any drainage, fever, chills, sinus pressure, jaw pain Went to dentist and no dental cause found   Depression screen [facility] 2/9 01/27/2019 12/01/2018 09/28/2018  Decreased Interest 0 0 0  Down, Depressed, Hopeless 0 0 0  PHQ - 2 Score 0 0 0    Fall Risk  01/27/2019 12/01/2018 09/28/2018 09/28/2018  Falls in the past year? 0 0 0 0  Number falls in past yr: 0 - 0 0  Injury with Fall? 0 - 0 0     No Known Allergies  Prior to Admission medications   Medication Sig Start Date End Date Taking? Authorizing Provider  Omega-3 Fatty Acids (FISH OIL) 1000 MG CPDR Take by mouth daily.   Yes [provider]  atorvastatin (LIPITOR) 40 MG tablet Take 1 tablet (40 mg total) by mouth daily. 09/28/18 12/27/18  Horald Pollen, MD  levothyroxine (SYNTHROID, LEVOTHROID) 75 MCG tablet Take 1 tablet (75 mcg total) by mouth daily before breakfast. 09/28/18 12/27/18  Horald Pollen, MD    Past Medical History:  Diagnosis Date  . Thyroid disease     Past Surgical History:  Procedure Laterality Date  . CARDIAC VALVE REPLACEMENT      Social History   Tobacco Use  . Smoking status: Never Smoker  . Smokeless tobacco: Never Used  Substance Use Topics  . Alcohol use: Never   Frequency: Never    Family History  Problem Relation Age of Onset  . Healthy Sister     ROS Per hpi  OBJECTIVE:  Today's Vitals   01/27/19 1746  BP: 114/75  Pulse: 79  Temp: 98.2 F (36.8 C)  TempSrc: Oral  SpO2: 96%  Weight: 185 lb (83.9 kg)  Height: 5' 4.75" (1.645 m)   Body mass index is 31.02 kg/m.   Physical Exam Vitals signs and nursing note reviewed.  Constitutional:      Appearance: She is well-developed.  HENT:     Head: Normocephalic and atraumatic.     Jaw: Tenderness (LEFT TMJ) and pain on movement present.     Right Ear: Hearing, tympanic membrane, ear canal and external ear normal.     Left Ear: Hearing, tympanic membrane, ear canal and external ear normal.  Eyes:     Conjunctiva/sclera: Conjunctivae normal.     Pupils: Pupils are equal, round, and reactive to light.  Neck:     Musculoskeletal: Neck supple.  Cardiovascular:     Rate and Rhythm: Normal rate and regular rhythm.     Heart sounds: Normal heart sounds. No murmur. No friction rub. No gallop.   Pulmonary:     Effort: Pulmonary effort is normal.     Breath sounds: Normal breath sounds. No  wheezing or rales.  Musculoskeletal:     Lumbar back: She exhibits tenderness, pain and spasm.       Back:     Comments: + 2 BLE reflexes 5/5 strength + Right SLR, - Left SLR  Lymphadenopathy:     Cervical: No cervical adenopathy.  Skin:    General: Skin is warm and dry.  Neurological:     Mental Status: She is alert and oriented to person, place, and time.     ASSESSMENT and PLAN  1. Arthralgia of left temporomandibular joint Discussed supportive measures, new meds r/se/b and RTC precautions.  2. Right sided sciatica Discussed supportive measures, new meds r/se/b and RTC precautions. Patient educational handout given.  3. Hypothyroidism, unspecified type Checking labs today, medications will be adjusted as needed.  - TSH  4. Hyperlipidemia, unspecified hyperlipidemia type Checking  labs today, medications will be adjusted as needed.  - Lipid panel - CMP14+EGFR  Other orders - methylPREDNISolone (MEDROL DOSEPAK) 4 MG TBPK tablet; Take per pack instructions - gabapentin (NEURONTIN) 300 MG capsule; Take 1 capsule (300 mg total) by mouth 3 (three) times daily.  Return if symptoms worsen or fail to improve.    Rutherford Guys, MD [facility] [place], [place] [address] Ph.  [phone] Fax [phone]

## 2019-01-27 NOTE — Patient Instructions (Addendum)
If you have lab work done today you will be contacted with your lab results within the next 2 weeks.  If you have not heard from us then please contact us. The fastest way to get your results is to register for My Chart.   IF you received an x-ray today, you will receive an invoice [facility]. Please contact [facility] at [phone] with questions or concerns regarding your invoice.   IF you received labwork today, you will receive an invoice from [facility]. Please contact [facility] at [phone] with questions or concerns regarding your invoice.   Our billing staff will not be able to assist you with questions regarding bills from these companies.  You will be contacted with the lab results as soon as they are available. The fastest way to get your results is to activate your My Chart account. Instructions are located on the last page of this paperwork. If you have not heard from us regarding the results in 2 weeks, please contact this office.     Rehabilitacin para la citica [facility] Pregunte al mdico qu ejercicios son seguros para usted. Haga los ejercicios exactamente como se lo haya indicado el mdico y gradelos como se lo hayan indicado. Es normal sentir un estiramiento leve, tironeo, opresin o Dentistmalestar al Manpower Inchacer estos ejercicios. Detngase de inmediato si siente un dolor repentino o Community education officerel dolor empeora. No comience a hacer estos ejercicios hasta que se lo indique el mdico. Ejercicios de elongacin y amplitud de movimiento Estos ejercicios calientan los msculos y las articulaciones, y mejoran el movimiento y la flexibilidad de la cadera y la espalda. Estos ejercicios tambin ayudan a Engineer, materialsaliviar el dolor, el adormecimiento y el hormigueo. Deslizamiento del nervio citico 1. Sintese en una silla con la cabeza hacia abajo en direccin al pecho. Coloque las manos detrs de la espalda. Deje caer los hombros hacia adelante. 2. Extienda lentamente una de  las piernas mientras inclina la cabeza hacia atrs como si estuviera mirando hacia el techo. Solo extienda la pierna tan lejos como pueda sin empeorar los sntomas. 3. Mantenga esta posicin durante __________ segundos. 4. Vuelva lentamente a la posicin inicial. 5. Repita el ejercicio con la otra pierna. Repita __________ veces. Realice este ejercicio __________ veces al da. Rodilla al pecho con aduccin de cadera y rotacin interna  1. Acustese boca arriba en una superficie firme con las piernas extendidas. 2. Flexione una rodilla y llvela hacia el pecho hasta que sienta un estiramiento suave en la parte inferior de la espalda y las nalgas. A continuacin, mueva la rodilla hacia el hombro que est en el lado contrario de la pierna. Esto se denomina rotacin interna y aduccin de la cadera. ? Mantenga la pierna en esta posicin tomando la parte frontal de la rodilla. 3. Mantenga esta posicin durante __________ segundos. 4. Vuelva lentamente a la posicin inicial. 5. Repita el ejercicio con la otra pierna. Repita __________ veces. Realice este ejercicio __________ veces al da. Extensin United Stationerssobre los codos, en decbito prono  1. Acustese boca abajo sobre una superficie firme. La cama puede ser demasiado blanda para este ejercicio. 2. Apyese sobre los codos. 3. Con los brazos, aydese a Haematologistlevantar el pecho hasta sentir un leve estiramiento en el abdomen y la parte inferior de la espalda. ? Arts administratorsto colocar algo de Autolivpeso corporal sobre los codos. Si no se siente cmodo, intente colocando almohadas debajo del pecho. ? Debe dejar la cadera inmvil sobre la superficie en la que est  apoyado. Mantenga la cadera y los msculos de la espalda relajados. 4. Mantenga esta posicin durante __________ segundos. 5. Afloje lentamente la parte superior del cuerpo y vuelva a la posicin inicial. Repita __________ veces. Realice este ejercicio __________ veces al da. Ejercicios de fortalecimiento Estos  ejercicios fortalecen la espalda y le otorgan resistencia. La resistencia es la capacidad de usar los msculos durante un tiempo prolongado, incluso despus de que se cansen. Inclinacin de la pelvis Este ejercicio fortalece los msculos que se encuentran en la parte profunda del abdomen. 1. Acustese boca arriba sobre una superficie firme. Doble las rodillas y [employer] pies planos sobre el piso. 2. Tensione los msculos abdominales. Eleve la pelvis hacia el techo y aplane la parte inferior de la espalda contra el suelo. ? Para realizar este ejercicio, puede colocar una toalla pequea debajo de la parte inferior de la espalda y presionar la espalda contra la toalla. 3. Mantenga esta posicin durante __________ segundos. 4. Relaje totalmente los msculos antes de repetir el ejercicio. Repita __________ veces. Realice este ejercicio __________ veces al da. Elevaciones alternadas de pierna y brazo  1. Davie manos y las rodillas sobre una superficie firme. Si est sobre un suelo duro, puede usar un elemento acolchado, como una alfombrilla para ejercicios, para apoyar las rodillas. 2. Alinee los brazos y las piernas. Las manos deben estar justo debajo de los hombros, y las rodillas debajo de la cadera. 3. Eleve la pierna izquierda hacia atrs. Al mismo tiempo, eleve el brazo derecho y Engineer, petroleum frente a usted. ? No eleve la pierna por encima de la cadera. ? No eleve el brazo por encima del hombro. ? Mantenga los msculos del abdomen y de la espalda contrados. ? Mantenga la cadera [employer] el suelo. ? No arquee la espalda. ? Mantenga el equilibrio con cuidado y no contenga la respiracin. 4. Mantenga esta posicin durante __________ segundos. 5. Vuelva lentamente a la posicin inicial. 6. Repita con su pierna derecha y su brazo izquierdo. Repita __________ veces. Realice este ejercicio __________ veces al da. Postura y Chad corporal La buena postura y la Engineer, agricultural  corporal saludable pueden ayudar a Theatre stage manager estrs en las articulaciones y los tejidos del cuerpo. La Therapist, nutritional se refiere a los movimientos y a las posiciones del cuerpo mientras realiza las actividades diarias. La postura es una parte de la Therapist, nutritional. Ardelia Mems buena postura significa que:  La columna est en su posicin natural de curvatura en S (neutral).  Los hombros estn [employer].  La cabeza no est inclinada hacia adelante. Siga esas pautas para mejorar la postura y Quarry manager en sus actividades diarias. De pie   Al estar de pie, mantenga la columna en la posicin neutral y los pies separados al ancho de caderas, aproximadamente. Mantenga las rodillas ligeramente flexionadas. [place], los hombros y las caderas deben estar alineados.  Cuando realice una tarea en la que deba estar de pie en el mismo sitio durante mucho tiempo, coloque un pie en un objeto estable de 2 a 4pulgadas (5a 10cm) de alto, como un taburete. Esto ayuda a que la columna mantenga una posicin neutral. Sentado   Cuando est sentado, mantenga la columna en posicin neutral y deje los pies apoyados en el suelo. Use un apoyapis, si es necesario, y [employer] muslos paralelos al suelo. Evite redondear los hombros e inclinar la cabeza hacia adelante.  Cuando trabaje en un escritorio o con una computadora, el escritorio  debe estar a una altura en la que las manos estn un poco ms Lucent Technologiesabajo que los codos. Deslice la silla debajo del escritorio, de modo de estar lo suficientemente cerca como para mantener una buena Melvillepostura.  Cuando trabaje con una computadora, coloque el monitor a una altura que le permita mirar derecho hacia adelante, sin tener que inclinar la cabeza hacia adelante o Posthacia atrs. Reposo  Al descansar o estar acostado, evite las posiciones que le causen ms dolor.  Si siente dolor al Kellogghacer actividades que exigen sentarse, inclinarse, agacharse o ponerse en  cuclillas, acustese en una posicin en la que el cuerpo no deba doblarse mucho. Por ejemplo, evite acurrucarse de costado con los brazos y las rodillas cerca del pecho (posicin fetal).  Si siente dolor con las actividades que exigen estar de pie durante mucho tiempo o Furniture conservator/restorerestirar los brazos, acustese con la columna en una posicin neutral y flexione ligeramente las rodillas. Pruebe con las siguientes posiciones: ? Teacher, musicAcostarse de costado con una almohada entre las rodillas. ? Acostarse boca arriba con una almohada debajo de las rodillas. Levantar objetos   Cuando tenga que levantar un objeto, mantenga los pies separados el ancho de los hombros, como Nevadamnimo, y apriete los msculos abdominales.  Flexione las rodillas y la cadera, y Dietitianmantenga la columna en posicin neutral. Es importante levantarse utilizando la fuerza de las piernas, no de la espalda. No trabe las rodillas hacia afuera.  Siempre pida ayuda a otra persona para levantar objetos pesados o incmodos. Esta informacin no tiene Theme park managercomo fin reemplazar el consejo del mdico. Asegrese de hacerle al mdico cualquier pregunta que tenga. Document Released: 06/24/2009 Document Revised: 08/31/2018 Document Reviewed: 08/31/2018 Elsevier Patient Education  2020 [employer]

## 2019-01-28 ENCOUNTER — Encounter: Payer: Self-pay | Admitting: Family Medicine

## 2019-01-28 LAB — TSH: TSH: 11.76 u[IU]/mL — ABNORMAL HIGH (ref 0.450–4.500)

## 2019-01-28 LAB — CMP14+EGFR
ALT: 32 IU/L (ref 0–32)
AST: 30 IU/L (ref 0–40)
Albumin/Globulin Ratio: 1.5 (ref 1.2–2.2)
Albumin: 4.2 g/dL (ref 3.8–4.9)
Alkaline Phosphatase: 77 IU/L (ref 39–117)
BUN/Creatinine Ratio: 15 (ref 9–23)
BUN: 10 mg/dL (ref 6–24)
Bilirubin Total: <0.2 mg/dL (ref 0.0–1.2)
CO2: 21 mmol/L (ref 20–29)
Calcium: 9 mg/dL (ref 8.7–10.2)
Chloride: 104 mmol/L (ref 96–106)
Creatinine, Ser: 0.65 mg/dL (ref 0.57–1.00)
GFR calc Af Amer: 116 mL/min/{1.73_m2} (ref >59)
GFR calc non Af Amer: 101 mL/min/{1.73_m2} (ref >59)
Globulin, Total: 2.8 g/dL (ref 1.5–4.5)
Glucose: 113 mg/dL — ABNORMAL HIGH (ref 65–99)
Potassium: 3.7 mmol/L (ref 3.5–5.2)
Sodium: 139 mmol/L (ref 134–144)
Total Protein: 7 g/dL (ref 6.0–8.5)

## 2019-01-28 LAB — LIPID PANEL
Chol/HDL Ratio: 5.3 ratio — ABNORMAL HIGH (ref 0.0–4.4)
Cholesterol, Total: 224 mg/dL — ABNORMAL HIGH (ref 100–199)
HDL: 42 mg/dL (ref >39)
LDL Calculated: 109 mg/dL — ABNORMAL HIGH (ref 0–99)
Triglycerides: 365 mg/dL — ABNORMAL HIGH (ref 0–149)
VLDL Cholesterol Cal: 73 mg/dL — ABNORMAL HIGH (ref 5–40)

## 2019-02-02 ENCOUNTER — Other Ambulatory Visit: Payer: Self-pay | Admitting: Family Medicine

## 2019-02-02 DIAGNOSIS — E039 Hypothyroidism, unspecified: Secondary | ICD-10-CM

## 2019-02-02 DIAGNOSIS — E079 Disorder of thyroid, unspecified: Secondary | ICD-10-CM

## 2019-02-02 MED ORDER — LEVOTHYROXINE SODIUM 100 MCG PO TABS: 100.0000 ug | ORAL_TABLET | Freq: Every day | ORAL | 1 refills | Status: DC

## 2019-02-03 ENCOUNTER — Other Ambulatory Visit: Payer: Self-pay

## 2019-02-03 ENCOUNTER — Ambulatory Visit (INDEPENDENT_AMBULATORY_CARE_PROVIDER_SITE_OTHER): Payer: 59 | Admitting: Family Medicine

## 2019-02-03 DIAGNOSIS — E039 Hypothyroidism, unspecified: Secondary | ICD-10-CM

## 2019-02-03 NOTE — Progress Notes (Signed)
Lm to return call for lab results.  

## 2019-02-03 NOTE — Progress Notes (Signed)
Nurse visit for lab only 

## 2019-02-04 LAB — TSH: TSH: 5.41 u[IU]/mL — ABNORMAL HIGH (ref 0.450–4.500)

## 2019-02-15 ENCOUNTER — Other Ambulatory Visit: Payer: Self-pay | Admitting: Family Medicine

## 2019-02-15 DIAGNOSIS — E039 Hypothyroidism, unspecified: Secondary | ICD-10-CM

## 2019-02-15 DIAGNOSIS — E079 Disorder of thyroid, unspecified: Secondary | ICD-10-CM

## 2019-02-15 MED ORDER — LEVOTHYROXINE SODIUM 125 MCG PO TABS: 125.0000 ug | ORAL_TABLET | Freq: Every day | ORAL | 1 refills | Status: DC

## 2019-02-24 ENCOUNTER — Other Ambulatory Visit: Payer: Self-pay | Admitting: Family Medicine

## 2019-03-29 ENCOUNTER — Other Ambulatory Visit: Payer: Self-pay | Admitting: Family Medicine

## 2019-05-08 ENCOUNTER — Other Ambulatory Visit: Payer: Self-pay

## 2019-05-08 ENCOUNTER — Ambulatory Visit (INDEPENDENT_AMBULATORY_CARE_PROVIDER_SITE_OTHER): Payer: 59 | Admitting: Emergency Medicine

## 2019-05-08 ENCOUNTER — Encounter: Payer: Self-pay | Admitting: Emergency Medicine

## 2019-05-08 VITALS — BP 106/73 | HR 80 | Temp 98.3°F | Resp 16 | Ht 64.0 in | Wt 185.8 lb

## 2019-05-08 DIAGNOSIS — E079 Disorder of thyroid, unspecified: Secondary | ICD-10-CM

## 2019-05-08 DIAGNOSIS — E785 Hyperlipidemia, unspecified: Secondary | ICD-10-CM | POA: Diagnosis not present

## 2019-05-08 DIAGNOSIS — Z124 Encounter for screening for malignant neoplasm of cervix: Secondary | ICD-10-CM

## 2019-05-08 DIAGNOSIS — Z9189 Other specified personal risk factors, not elsewhere classified: Secondary | ICD-10-CM

## 2019-05-08 DIAGNOSIS — Z1211 Encounter for screening for malignant neoplasm of colon: Secondary | ICD-10-CM

## 2019-05-08 DIAGNOSIS — Z7189 Other specified counseling: Secondary | ICD-10-CM

## 2019-05-08 MED ORDER — LEVOTHYROXINE SODIUM 125 MCG PO TABS: 125.0000 ug | ORAL_TABLET | Freq: Every day | ORAL | 3 refills | Status: DC

## 2019-05-08 MED ORDER — ATORVASTATIN CALCIUM 40 MG PO TABS: 40.0000 mg | ORAL_TABLET | Freq: Every day | ORAL | 3 refills | Status: DC

## 2019-05-08 NOTE — Patient Instructions (Addendum)
   If you have lab work done today you will be contacted with your lab results within the next 2 weeks.  If you have not heard from us then please contact us. The fastest way to get your results is to register for My Chart.   IF you received an x-ray today, you will receive an invoice from [facility]. Please contact [facility] at [phone] with questions or concerns regarding your invoice.   IF you received labwork today, you will receive an invoice from [facility]. Please contact [facility] at [phone] with questions or concerns regarding your invoice.   Our billing staff will not be able to assist you with questions regarding bills from these companies.  You will be contacted with the lab results as soon as they are available. The fastest way to get your results is to activate your My Chart account. Instructions are located on the last page of this paperwork. If you have not heard from us regarding the results in 2 weeks, please contact this office.     Mantenimiento de la salud en las mujeres Health Maintenance, Female Adoptar un estilo de vida saludable y recibir atencin preventiva son importantes para promover la salud y el bienestar. Consulte al mdico sobre:  El esquema adecuado para hacerse pruebas y exmenes peridicos.  Cosas que puede hacer por su cuenta para prevenir enfermedades y mantenerse sana. Qu debo saber sobre la dieta, el peso y el ejercicio? Consuma una dieta saludable   Consuma una dieta que incluya muchas verduras, frutas, productos lcteos con bajo contenido de grasa y protenas magras.  No consuma muchos alimentos ricos en grasas slidas, azcares agregados o sodio. Mantenga un peso saludable El ndice de masa muscular (IMC) se utiliza para identificar problemas de peso. Proporciona una estimacin de la grasa corporal basndose en el peso y la altura. Su mdico puede ayudarle a determinar su IMC y a lograr o mantener un peso  saludable. Haga ejercicio con regularidad Haga ejercicio con regularidad. Esta es una de las prcticas ms importantes que puede hacer por su salud. La mayora de los adultos deben seguir estas pautas:  Realizar, al menos, 150minutos de actividad fsica por semana. El ejercicio debe aumentar la frecuencia cardaca y hacerlo transpirar (ejercicio de intensidad moderada).  Hacer ejercicios de fortalecimiento por lo menos dos veces por semana. Agregue esto a su plan de ejercicio de intensidad moderada.  Pasar menos tiempo sentados. Incluso la actividad fsica ligera puede ser beneficiosa. Controle sus niveles de colesterol y lpidos en la sangre Comience a realizarse anlisis de lpidos y colesterol en la sangre a los 20aos y luego reptalos cada 5aos. Hgase controlar los niveles de colesterol con mayor frecuencia si:  Sus niveles de lpidos y colesterol son altos.  Es mayor de 40aos.  Presenta un alto riesgo de padecer enfermedades cardacas. Qu debo saber sobre las pruebas de deteccin del cncer? Segn su historia clnica y sus antecedentes familiares, es posible que deba realizarse pruebas de deteccin del cncer en diferentes edades. Esto puede incluir pruebas de deteccin de lo siguiente:  Cncer de mama.  Cncer de cuello uterino.  Cncer colorrectal.  Cncer de piel.  Cncer de pulmn. Qu debo saber sobre la enfermedad cardaca, la diabetes y la hipertensin arterial? Presin arterial y enfermedad cardaca  La hipertensin arterial causa enfermedades cardacas y aumenta el riesgo de accidente cerebrovascular. Es ms probable que esto se manifieste en las personas que tienen lecturas de presin arterial alta, tienen ascendencia africana o   tienen sobrepeso.  Hgase controlar la presin arterial: ? Cada 3 a 5 aos si tiene entre 18 y 39 aos. ? Todos los aos si es mayor de 40aos. Diabetes Realcese exmenes de deteccin de la diabetes con regularidad. Este  anlisis revisa el nivel de azcar en la sangre en ayunas. Hgase las pruebas de deteccin:  Cada tresaos despus de los 40aos de edad si tiene un peso normal y un bajo riesgo de padecer diabetes.  Con ms frecuencia y a partir de una edad inferior si tiene sobrepeso o un alto riesgo de padecer diabetes. Qu debo saber sobre la prevencin de infecciones? Hepatitis B Si tiene un riesgo ms alto de contraer hepatitis B, debe someterse a un examen de deteccin de este virus. Hable con el mdico para averiguar si tiene riesgo de contraer la infeccin por hepatitis B. Hepatitis C Se recomienda el anlisis a:  Todos los que nacieron entre 1945 y 1965.  Todas las personas que tengan un riesgo de haber contrado hepatitis C. Enfermedades de transmisin sexual (ETS)  Hgase las pruebas de deteccin de ITS, incluidas la gonorrea y la clamidia, si: ? Es sexualmente activa y es menor de 24aos. ? Es mayor de 24aos, y el mdico le informa que corre riesgo de tener este tipo de infecciones. ? La actividad sexual ha cambiado desde que le hicieron la ltima prueba de deteccin y tiene un riesgo mayor de tener clamidia o gonorrea. Pregntele al mdico si usted tiene riesgo.  Pregntele al mdico si usted tiene un alto riesgo de contraer VIH. El mdico tambin puede recomendarle un medicamento recetado para ayudar a evitar la infeccin por el VIH. Si elige tomar medicamentos para prevenir el VIH, primero debe hacerse los anlisis de deteccin del VIH. Luego debe hacerse anlisis cada 3meses mientras est tomando los medicamentos. Embarazo  Si est por dejar de menstruar (fase premenopusica) y usted puede quedar embarazada, busque asesoramiento antes de quedar embarazada.  Tome de 400 a 800microgramos (mcg) de cido flico todos los das si queda embarazada.  Pida mtodos de control de la natalidad (anticonceptivos) si desea evitar un embarazo no deseado. Osteoporosis y menopausia La  osteoporosis es una enfermedad en la que los huesos pierden los minerales y la fuerza por el avance de la edad. El resultado pueden ser fracturas en los huesos. Si tiene 65aos o ms, o si est en riesgo de sufrir osteoporosis y fracturas, pregunte a su mdico si debe:  Hacerse pruebas de deteccin de prdida sea.  Tomar un suplemento de calcio o de vitamina D para reducir el riesgo de fracturas.  Recibir terapia de reemplazo hormonal (TRH) para tratar los sntomas de la menopausia. Siga estas instrucciones en su casa: Estilo de vida  No consuma ningn producto que contenga nicotina o tabaco, como cigarrillos, cigarrillos electrnicos y tabaco de mascar. Si necesita ayuda para dejar de fumar, consulte al mdico.  No consuma drogas.  No comparta agujas.  Solicite ayuda a su mdico si necesita apoyo o informacin para abandonar las drogas. Consumo de alcohol  No beba alcohol si: ? Su mdico le indica no hacerlo. ? Est embarazada, puede estar embarazada o est tratando de quedar embarazada.  Si bebe alcohol: ? Limite la cantidad que consume de 0 a 1 medida por da. ? Limite la ingesta si est amamantando.  Est atento a la cantidad de alcohol que hay en las bebidas que toma. En los [place], una medida equivale a una botella de cerveza de 12oz (355ml),   un vaso de vino de 5oz (148ml) o un vaso de una bebida alcohlica de alta graduacin de 1oz (44ml). Instrucciones generales  Realcese los estudios de rutina de la salud, dentales y de la vista.  Mantngase al da con las vacunas.  Infrmele a su mdico si: ? Se siente deprimida con frecuencia. ? Alguna vez ha sido vctima de maltrato o no se siente segura en su casa. Resumen  Adoptar un estilo de vida saludable y recibir atencin preventiva son importantes para promover la salud y el bienestar.  Siga las instrucciones del mdico acerca de una dieta saludable, el ejercicio y la realizacin de pruebas o exmenes  para detectar enfermedades.  Siga las instrucciones del mdico con respecto al control del colesterol y la presin arterial. Esta informacin no tiene como fin reemplazar el consejo del mdico. Asegrese de hacerle al mdico cualquier pregunta que tenga. Document Released: 06/25/2011 Document Revised: 07/27/2018 Document Reviewed: 07/27/2018 Elsevier Patient Education  2020 Elsevier Inc.  

## 2019-05-08 NOTE — Progress Notes (Signed)
Tracey Campbell 53 y.o.   Chief Complaint  Patient presents with  . Medication Refill    and a note for job to return to work after traveling from [place]    HISTORY OF PRESENT ILLNESS: This is a 53 y.o. female recently returned from [place] from visiting daughter.  Employer requesting medical clearance note and negative Covid examination.  Asymptomatic. Also has a history of thyroid disorder and dyslipidemia, requesting medication refill.  Takes Synthroid on Lipitor. No other complaint or medical concern today.  HPI   Prior to Admission medications   Medication Sig Start Date End Date Taking? Authorizing Provider  gabapentin (NEURONTIN) 300 MG capsule TAKE 1 CAPSULE BY MOUTH THREE TIMES DAILY 03/29/19  Yes Tracey Lipps, MD  levothyroxine (SYNTHROID) 125 MCG tablet Take 1 tablet (125 mcg total) by mouth daily before breakfast. 05/08/19 08/06/19 Yes Tracey Campbell, Tracey Kempf, MD  methylPREDNISolone (MEDROL DOSEPAK) 4 MG TBPK tablet Take per pack instructions 01/27/19  Yes Tracey Lipps, MD  Omega-3 Fatty Acids (FISH OIL) 1000 MG CPDR Take by mouth daily.   Yes [provider]  atorvastatin (LIPITOR) 40 MG tablet Take 1 tablet (40 mg total) by mouth daily. 05/08/19 08/06/19  Tracey Quint, MD    No Known Allergies  Patient Active Problem List   Diagnosis Date Noted  . Low back pain 09/28/2018  . History of high cholesterol 09/28/2018  . Thyroid disorder 09/28/2018    Past Medical History:  Diagnosis Date  . Thyroid disease     Past Surgical History:  Procedure Laterality Date  . CARDIAC VALVE REPLACEMENT      Social History   Socioeconomic History  . Marital status: Married    Spouse name: Not on file  . Number of children: Not on file  . Years of education: Not on file  . Highest education level: Not on file  Occupational History  . Not on file  Social Needs  . Financial resource strain: Not on file  . Food insecurity    Worry: Not on file   Inability: Not on file  . Transportation needs    Medical: Not on file    Non-medical: Not on file  Tobacco Use  . Smoking status: Never Smoker  . Smokeless tobacco: Never Used  Substance and Sexual Activity  . Alcohol use: Never    Frequency: Never  . Drug use: Never  . Sexual activity: Not on file  Lifestyle  . Physical activity    Days per week: Not on file    Minutes per session: Not on file  . Stress: Not on file  Relationships  . Social Musician on phone: Not on file    Gets together: Not on file    Attends religious service: Not on file    Active member of club or organization: Not on file    Attends meetings of clubs or organizations: Not on file    Relationship status: Not on file  . Intimate partner violence    Fear of current or ex partner: Not on file    Emotionally abused: Not on file    Physically abused: Not on file    Forced sexual activity: Not on file  Other Topics Concern  . Not on file  Social History Narrative  . Not on file    Family History  Problem Relation Age of Onset  . Healthy Sister      Review of Systems  Constitutional: Negative.  Negative for chills and fever.  HENT: Negative.  Negative for congestion and sore throat.   Respiratory: Negative.  Negative for cough and shortness of breath.   Cardiovascular: Negative.  Negative for chest pain and palpitations.  Gastrointestinal: Negative for blood in stool, diarrhea, nausea and vomiting.  Genitourinary: Negative.  Negative for dysuria and hematuria.  Musculoskeletal: Negative for myalgias.  Skin: Negative.  Negative for rash.  Neurological: Negative.  Negative for dizziness and headaches.  Endo/Heme/Allergies: Negative.   All other systems reviewed and are negative.  Today's Vitals   05/08/19 1509  BP: 106/73  Pulse: 80  Resp: 16  Temp: 98.3 F (36.8 C)  TempSrc: Oral  SpO2: 96%  Weight: 185 lb 12.8 oz (84.3 kg)  Height: 5\' 4"  (1.626 m)   Body mass index is  31.89 kg/m.   Physical Exam Vitals signs reviewed.  Constitutional:      Appearance: Normal appearance.  HENT:     Head: Normocephalic.  Eyes:     Extraocular Movements: Extraocular movements intact.     Conjunctiva/sclera: Conjunctivae normal.     Pupils: Pupils are equal, round, and reactive to light.  Neck:     Musculoskeletal: Normal range of motion.  Cardiovascular:     Rate and Rhythm: Normal rate and regular rhythm.     Pulses: Normal pulses.     Heart sounds: Normal heart sounds.  Musculoskeletal: Normal range of motion.  Skin:    General: Skin is warm and dry.     Capillary Refill: Capillary refill takes less than 2 seconds.  Neurological:     General: No focal deficit present.     Mental Status: She is alert and oriented to person, place, and time.    A total of 25 minutes was spent in the room with the patient, greater than 50% of which was in counseling/coordination of care regarding chronic medical conditions, medications and side effects, COVID-19 infection and symptoms, need for blood work, ED precautions, prognosis and need for follow-up.   ASSESSMENT & PLAN: Tracey Campbell was seen today for medication refill.  Diagnoses and all orders for this visit:  Thyroid disorder -     levothyroxine (SYNTHROID) 125 MCG tablet; Take 1 tablet (125 mcg total) by mouth daily before breakfast.  Dyslipidemia -     atorvastatin (LIPITOR) 40 MG tablet; Take 1 tablet (40 mg total) by mouth daily.  Colon cancer screening -     Ambulatory referral to Gastroenterology  Cervical cancer screening -     Ambulatory referral to Obstetrics / Gynecology  At increased risk of exposure to COVID-19 virus -     SAR CoV2 Serology (COVID 19)AB(IGG)IA  Advice given about COVID-19 virus infection    Patient Instructions       If you have lab work done today you will be contacted with your lab results within the next 2 weeks.  If you have not heard from Tracey Campbell then please contact us. The  fastest way to get your results is to register for My Chart.   IF you received an x-ray today, you will receive an invoice from [facility]. Please contact [facility] at [phone] with questions or concerns regarding your invoice.   IF you received labwork today, you will receive an invoice from [facility]. Please contact [facility] at [phone] with questions or concerns regarding your invoice.   Our billing staff will not be able to assist you with questions regarding bills from these companies.  You will be contacted  with the lab results as soon as they are available. The fastest way to get your results is to activate your My Chart account. Instructions are located on the last page of this paperwork. If you have not heard from [place] regarding the results in 2 weeks, please contact this office.     Mantenimiento de Radiographer, therapeutic en las mujeres Health Maintenance, Female Adoptar un estilo de vida saludable y recibir atencin preventiva son importantes para promover la salud y Counsellor. Consulte al mdico sobre:  El esquema adecuado para hacerse pruebas y exmenes peridicos.  Cosas que puede hacer por su cuenta para prevenir enfermedades y [employer]. Qu debo saber sobre la dieta, el peso y el ejercicio? Consuma una dieta saludable   Consuma una dieta que incluya muchas verduras, frutas, productos lcteos con bajo contenido de [place] y Associate Professor.  No consuma muchos alimentos ricos en grasas slidas, azcares agregados o sodio. Mantenga un peso saludable El ndice de masa muscular [facility]) se [place] para identificar problemas de [place]. Proporciona una estimacin de la grasa corporal basndose en el peso y la altura. Su mdico puede ayudarle a Engineer, site IMC y a Personnel officer o Pharmacologist un peso saludable. Haga ejercicio con regularidad Haga ejercicio con regularidad. Esta es una de las prcticas ms importantes que puede hacer por su salud. La mayora de los  adultos deben seguir estas pautas:  Education officer, environmental, al menos, de actividad fsica por semana. El ejercicio debe aumentar la frecuencia cardaca y Media planner transpirar (ejercicio de intensidad moderada).  Hacer ejercicios de fortalecimiento por lo [employer] por semana. Agregue esto a su plan de ejercicio de intensidad moderada.  Pasar menos tiempo sentados. Incluso la actividad fsica ligera puede ser beneficiosa. Controle sus niveles de colesterol y lpidos en la sangre Comience a realizarse anlisis de lpidos y Oncologist en la sangre a los 20aos y luego reptalos cada 5aos. Hgase controlar los niveles de colesterol con mayor frecuencia si:  Sus niveles de lpidos y colesterol son altos.  Es mayor de 40aos.  Presenta un alto riesgo de padecer enfermedades cardacas. Qu debo saber sobre las pruebas de deteccin del cncer? Segn su historia clnica y sus antecedentes familiares, es posible que deba realizarse pruebas de deteccin del cncer en diferentes edades. Esto puede incluir pruebas de deteccin de lo siguiente:  Cncer de mama.  Cncer de cuello uterino.  Cncer colorrectal.  Cncer de piel.  Cncer de pulmn. Qu debo saber sobre la enfermedad cardaca, la diabetes y la hipertensin arterial? Presin arterial y enfermedad cardaca  La hipertensin arterial causa enfermedades cardacas y [place] el riesgo de accidente cerebrovascular. Es ms probable que esto se manifieste en las personas que tienen lecturas de presin arterial alta, tienen ascendencia africana o tienen sobrepeso.  Hgase controlar la presin arterial: ? Cada 3 a 5 aos si tiene entre 18 y 74 aos. ? Todos los aos si es mayor de [place]. Diabetes Realcese exmenes de deteccin de la diabetes con regularidad. Este anlisis revisa el nivel de azcar en la sangre en [place]. Hgase las pruebas de deteccin:  Cada tresaos despus de los 40aos de edad si tiene un peso normal y un  bajo riesgo de padecer diabetes.  Con ms frecuencia y a partir de [place] edad inferior si tiene sobrepeso o un alto riesgo de padecer diabetes. Qu debo saber sobre la prevencin de infecciones? Hepatitis B Si tiene un riesgo ms alto de contraer hepatitis B, debe someterse a Nurse, learning disability de deteccin  de este virus. Hable con el mdico para averiguar si tiene riesgo de contraer la infeccin por hepatitis B. Hepatitis C Se recomienda el anlisis a:  [employer] 1945 y 1965.  Todas las personas que tengan un riesgo de haber contrado hepatitis C. Enfermedades de transmisin sexual (ETS)  Hgase las pruebas de Programme researcher, broadcasting/film/video de ITS, incluidas la gonorrea y la clamidia, si: ? Es sexualmente activa y es menor de [place]. ? Es mayor de 24aos, y Investment banker, operational informa que corre riesgo de tener este tipo de infecciones. ? La actividad sexual ha cambiado desde que le hicieron la ltima prueba de deteccin y tiene un riesgo mayor de Best boy clamidia o Radio broadcast assistant. Pregntele al mdico si usted tiene riesgo.  Pregntele al mdico si usted tiene un alto riesgo de Museum/gallery curator VIH. El mdico tambin puede recomendarle un medicamento recetado para ayudar a evitar la infeccin por el VIH. Si elige tomar medicamentos para prevenir el VIH, primero debe [employer] de deteccin del VIH. Luego debe hacerse anlisis cada 66meses mientras est tomando los medicamentos. Embarazo  Si est por dejar de Librarian, academic (fase premenopusica) y usted puede quedar [place], busque asesoramiento antes de [place].  Tome de 400 a [id number] (mcg) de cido [employer] si [place].  Pida mtodos de control de la natalidad (anticonceptivos) si desea evitar un embarazo no deseado. Osteoporosis y [place] La osteoporosis es una enfermedad en la que los huesos pierden los minerales y la fuerza por el avance de la edad. El resultado pueden ser fracturas en los [place]. Si tiene 65aos o  ms, o si est en riesgo de sufrir osteoporosis y fracturas, pregunte a su mdico si debe:  Hacerse pruebas de deteccin de prdida sea.  Tomar un suplemento de calcio o de vitamina D para reducir el riesgo de fracturas.  Recibir terapia de reemplazo hormonal (TRH) para tratar los sntomas de la menopausia. Siga estas instrucciones en su casa: Estilo de vida  No consuma ningn producto que contenga nicotina o tabaco, como cigarrillos, cigarrillos electrnicos y tabaco de Higher education careers adviser. Si necesita ayuda para dejar de fumar, consulte al mdico.  No consuma drogas.  No comparta agujas.  Solicite ayuda a su mdico si necesita apoyo o informacin para abandonar las drogas. Consumo de alcohol  No beba alcohol si: ? Su mdico le indica no hacerlo. ? Est embarazada, puede estar embarazada o est tratando de quedar embarazada.  Si bebe alcohol: ? Limite la cantidad que consume de 0 a 1 medida por da. ? Limite la ingesta si est amamantando.  Est atento a la cantidad de alcohol que hay en las bebidas que toma. En los [place], una medida equivale a una botella de cerveza de 12oz (3102ml), un vaso de vino de 5oz (167ml) o un vaso de una bebida alcohlica de alta graduacin de 1oz (86ml). Instrucciones generales  Realcese los estudios de rutina de la salud, dentales y de Public librarian.  [place].  Infrmele a su mdico si: ? Se siente deprimida con frecuencia. ? Alguna vez ha sido vctima de [place] o no se siente segura en su casa. Resumen  Adoptar un estilo de vida saludable y recibir atencin preventiva son importantes para promover la salud y Musician.  Siga las instrucciones del mdico acerca de una dieta saludable, el ejercicio y la realizacin de pruebas o exmenes para Engineer, building services.  Siga las instrucciones del mdico con respecto al control del colesterol  y la presin arterial. Esta informacin no tiene Theme park managercomo fin reemplazar el  consejo del mdico. Asegrese de hacerle al mdico cualquier pregunta que tenga. Document Released: 06/25/2011 Document Revised: 07/27/2018 Document Reviewed: 07/27/2018 Elsevier Patient Education  2020 Elsevier Inc.      Edwina BarthMiguel Haroun Cotham, MD [facility]

## 2019-05-09 LAB — EUROIMMUN SARS-COV-2 AB, IGG: Euroimmun SARS-CoV-2 Ab, IgG: NEGATIVE

## 2019-05-09 LAB — SAR COV2 SEROLOGY (COVID19)AB(IGG),IA

## 2019-05-10 ENCOUNTER — Telehealth: Payer: Self-pay | Admitting: Emergency Medicine

## 2019-05-10 NOTE — Telephone Encounter (Signed)
Spoke with pt and informed her of results.  She verbalized understanding and will pick up a copy from the front.

## 2019-05-10 NOTE — Telephone Encounter (Signed)
°  Pt req call back about her COVID  Test

## 2019-05-16 ENCOUNTER — Telehealth: Payer: Self-pay | Admitting: Emergency Medicine

## 2019-05-16 ENCOUNTER — Other Ambulatory Visit: Payer: Self-pay

## 2019-05-16 DIAGNOSIS — Z20822 Contact with and (suspected) exposure to covid-19: Secondary | ICD-10-CM

## 2019-05-16 NOTE — Telephone Encounter (Signed)
Copied from [place] [phone]. Topic: General - Other >> May 16, 2019 10:21 AM Celene Kras A wrote: Reason for CRM: Pts husband called stating the pt is needing to have a second covid test due to her employer. Pt is needing this done ASAP because she has been out of work for 10 days. Please advise.

## 2019-05-18 LAB — NOVEL CORONAVIRUS, NAA: SARS-CoV-2, NAA: NOT DETECTED

## 2019-05-23 NOTE — Telephone Encounter (Signed)
Called and LVM to call office back about testing. Advised pt about Covid testing being done at the [place] location.

## 2019-05-29 NOTE — Telephone Encounter (Signed)
Spoke with pt husband and he informed me that she has been retested and her results was negative, I verbalized understanding.

## 2019-11-06 ENCOUNTER — Telehealth: Payer: Self-pay | Admitting: *Deleted

## 2019-11-06 ENCOUNTER — Ambulatory Visit (INDEPENDENT_AMBULATORY_CARE_PROVIDER_SITE_OTHER): Payer: 59 | Admitting: Emergency Medicine

## 2019-11-06 ENCOUNTER — Encounter: Payer: Self-pay | Admitting: Emergency Medicine

## 2019-11-06 ENCOUNTER — Other Ambulatory Visit: Payer: Self-pay

## 2019-11-06 VITALS — BP 101/56 | HR 74 | Temp 97.8°F | Resp 16 | Ht 64.0 in | Wt 187.0 lb

## 2019-11-06 DIAGNOSIS — E785 Hyperlipidemia, unspecified: Secondary | ICD-10-CM

## 2019-11-06 DIAGNOSIS — E079 Disorder of thyroid, unspecified: Secondary | ICD-10-CM | POA: Diagnosis not present

## 2019-11-06 DIAGNOSIS — Z1211 Encounter for screening for malignant neoplasm of colon: Secondary | ICD-10-CM

## 2019-11-06 DIAGNOSIS — B351 Tinea unguium: Secondary | ICD-10-CM | POA: Diagnosis not present

## 2019-11-06 DIAGNOSIS — Z1231 Encounter for screening mammogram for malignant neoplasm of breast: Secondary | ICD-10-CM

## 2019-11-06 NOTE — Telephone Encounter (Signed)
Faxed Cologuard requisition to [facility]. Confirmation  Page 2:47 pm.

## 2019-11-06 NOTE — Patient Instructions (Signed)
Mantenimiento de la salud en las mujeres Health Maintenance, Female Adoptar un estilo de vida saludable y recibir atencin preventiva son importantes para promover la salud y el bienestar. Consulte al mdico sobre:  El esquema adecuado para hacerse pruebas y exmenes peridicos.  Cosas que puede hacer por su cuenta para prevenir enfermedades y mantenerse sana. Qu debo saber sobre la dieta, el peso y el ejercicio? Consuma una dieta saludable   Consuma una dieta que incluya muchas verduras, frutas, productos lcteos con bajo contenido de grasa y protenas magras.  No consuma muchos alimentos ricos en grasas slidas, azcares agregados o sodio. Mantenga un peso saludable El ndice de masa muscular (IMC) se utiliza para identificar problemas de peso. Proporciona una estimacin de la grasa corporal basndose en el peso y la altura. Su mdico puede ayudarle a determinar su IMC y a lograr o mantener un peso saludable. Haga ejercicio con regularidad Haga ejercicio con regularidad. Esta es una de las prcticas ms importantes que puede hacer por su salud. La mayora de los adultos deben seguir estas pautas:  Realizar, al menos, 150minutos de actividad fsica por semana. El ejercicio debe aumentar la frecuencia cardaca y hacerlo transpirar (ejercicio de intensidad moderada).  Hacer ejercicios de fortalecimiento por lo menos dos veces por semana. Agregue esto a su plan de ejercicio de intensidad moderada.  Pasar menos tiempo sentados. Incluso la actividad fsica ligera puede ser beneficiosa. Controle sus niveles de colesterol y lpidos en la sangre Comience a realizarse anlisis de lpidos y colesterol en la sangre a los 20aos y luego reptalos cada 5aos. Hgase controlar los niveles de colesterol con mayor frecuencia si:  Sus niveles de lpidos y colesterol son altos.  Es mayor de 40aos.  Presenta un alto riesgo de padecer enfermedades cardacas. Qu debo saber sobre las pruebas de  deteccin del cncer? Segn su historia clnica y sus antecedentes familiares, es posible que deba realizarse pruebas de deteccin del cncer en diferentes edades. Esto puede incluir pruebas de deteccin de lo siguiente:  Cncer de mama.  Cncer de cuello uterino.  Cncer colorrectal.  Cncer de piel.  Cncer de pulmn. Qu debo saber sobre la enfermedad cardaca, la diabetes y la hipertensin arterial? Presin arterial y enfermedad cardaca  La hipertensin arterial causa enfermedades cardacas y aumenta el riesgo de accidente cerebrovascular. Es ms probable que esto se manifieste en las personas que tienen lecturas de presin arterial alta, tienen ascendencia africana o tienen sobrepeso.  Hgase controlar la presin arterial: ? Cada 3 a 5 aos si tiene entre 18 y 39 aos. ? Todos los aos si es mayor de 40aos. Diabetes Realcese exmenes de deteccin de la diabetes con regularidad. Este anlisis revisa el nivel de azcar en la sangre en ayunas. Hgase las pruebas de deteccin:  Cada tresaos despus de los 40aos de edad si tiene un peso normal y un bajo riesgo de padecer diabetes.  Con ms frecuencia y a partir de una edad inferior si tiene sobrepeso o un alto riesgo de padecer diabetes. Qu debo saber sobre la prevencin de infecciones? Hepatitis B Si tiene un riesgo ms alto de contraer hepatitis B, debe someterse a un examen de deteccin de este virus. Hable con el mdico para averiguar si tiene riesgo de contraer la infeccin por hepatitis B. Hepatitis C Se recomienda el anlisis a:  Todos los que nacieron entre 1945 y 1965.  Todas las personas que tengan un riesgo de haber contrado hepatitis C. Enfermedades de transmisin sexual (ETS)  Hgase las   pruebas de deteccin de ITS, incluidas la gonorrea y la clamidia, si: ? Es sexualmente activa y es menor de 24aos. ? Es mayor de 24aos, y el mdico le informa que corre riesgo de tener este tipo de  infecciones. ? La actividad sexual ha cambiado desde que le hicieron la ltima prueba de deteccin y tiene un riesgo mayor de tener clamidia o gonorrea. Pregntele al mdico si usted tiene riesgo.  Pregntele al mdico si usted tiene un alto riesgo de contraer VIH. El mdico tambin puede recomendarle un medicamento recetado para ayudar a evitar la infeccin por el VIH. Si elige tomar medicamentos para prevenir el VIH, primero debe hacerse los anlisis de deteccin del VIH. Luego debe hacerse anlisis cada 3meses mientras est tomando los medicamentos. Embarazo  Si est por dejar de menstruar (fase premenopusica) y usted puede quedar embarazada, busque asesoramiento antes de quedar embarazada.  Tome de 400 a 800microgramos (mcg) de cido flico todos los das si queda embarazada.  Pida mtodos de control de la natalidad (anticonceptivos) si desea evitar un embarazo no deseado. Osteoporosis y menopausia La osteoporosis es una enfermedad en la que los huesos pierden los minerales y la fuerza por el avance de la edad. El resultado pueden ser fracturas en los huesos. Si tiene 65aos o ms, o si est en riesgo de sufrir osteoporosis y fracturas, pregunte a su mdico si debe:  Hacerse pruebas de deteccin de prdida sea.  Tomar un suplemento de calcio o de vitamina D para reducir el riesgo de fracturas.  Recibir terapia de reemplazo hormonal (TRH) para tratar los sntomas de la menopausia. Siga estas instrucciones en su casa: Estilo de vida  No consuma ningn producto que contenga nicotina o tabaco, como cigarrillos, cigarrillos electrnicos y tabaco de mascar. Si necesita ayuda para dejar de fumar, consulte al mdico.  No consuma drogas.  No comparta agujas.  Solicite ayuda a su mdico si necesita apoyo o informacin para abandonar las drogas. Consumo de alcohol  No beba alcohol si: ? Su mdico le indica no hacerlo. ? Est embarazada, puede estar embarazada o est tratando de quedar  embarazada.  Si bebe alcohol: ? Limite la cantidad que consume de 0 a 1 medida por da. ? Limite la ingesta si est amamantando.  Est atento a la cantidad de alcohol que hay en las bebidas que toma. En los [place], una medida equivale a una botella de cerveza de 12oz (355ml), un vaso de vino de 5oz (148ml) o un vaso de una bebida alcohlica de alta graduacin de 1oz (44ml). Instrucciones generales  Realcese los estudios de rutina de la salud, dentales y de la vista.  Mantngase al da con las vacunas.  Infrmele a su mdico si: ? Se siente deprimida con frecuencia. ? Alguna vez ha sido vctima de maltrato o no se siente segura en su casa. Resumen  Adoptar un estilo de vida saludable y recibir atencin preventiva son importantes para promover la salud y el bienestar.  Siga las instrucciones del mdico acerca de una dieta saludable, el ejercicio y la realizacin de pruebas o exmenes para detectar enfermedades.  Siga las instrucciones del mdico con respecto al control del colesterol y la presin arterial. Esta informacin no tiene como fin reemplazar el consejo del mdico. Asegrese de hacerle al mdico cualquier pregunta que tenga. Document Revised: 07/27/2018 Document Reviewed: 07/27/2018 Elsevier Patient Education  2020 Elsevier Inc.  

## 2019-11-06 NOTE — Progress Notes (Addendum)
Cylah Fannin 54 y.o.   Chief Complaint  Patient presents with  . thyroid disorder    follow up 6 months    HISTORY OF PRESENT ILLNESS: This is a 54 y.o. female with chronic medical problems here for follow-up. 1.  Thyroid disorder: On Synthroid 125 mcg daily. 2.  Dyslipidemia: On Lipitor 40 mg daily. Needs mammogram referral and colon cancer screening. No complaints or medical concerns today.  HPI   Prior to Admission medications   Medication Sig Start Date End Date Taking? Authorizing Provider  Omega-3 Fatty Acids (FISH OIL) 1000 MG CPDR Take by mouth daily.   Yes [provider]  atorvastatin (LIPITOR) 40 MG tablet Take 1 tablet (40 mg total) by mouth daily. 05/08/19 08/06/19  Georgina Quint, MD  gabapentin (NEURONTIN) 300 MG capsule TAKE 1 CAPSULE BY MOUTH THREE TIMES DAILY Patient not taking: Reported on 11/06/2019 03/29/19   Myles Lipps, MD  levothyroxine (SYNTHROID) 125 MCG tablet Take 1 tablet (125 mcg total) by mouth daily before breakfast. 05/08/19 08/06/19  Georgina Quint, MD  methylPREDNISolone (MEDROL DOSEPAK) 4 MG TBPK tablet Take per pack instructions Patient not taking: Reported on 11/06/2019 01/27/19   Myles Lipps, MD    No Known Allergies  Patient Active Problem List   Diagnosis Date Noted  . History of high cholesterol 09/28/2018  . Thyroid disorder 09/28/2018    Past Medical History:  Diagnosis Date  . Thyroid disease     Past Surgical History:  Procedure Laterality Date  . CARDIAC VALVE REPLACEMENT      Social History   Socioeconomic History  . Marital status: Married    Spouse name: Not on file  . Number of children: Not on file  . Years of education: Not on file  . Highest education level: Not on file  Occupational History  . Not on file  Tobacco Use  . Smoking status: Never Smoker  . Smokeless tobacco: Never Used  Substance and Sexual Activity  . Alcohol use: Never  . Drug use: Never  . Sexual  activity: Not on file  Other Topics Concern  . Not on file  Social History Narrative  . Not on file   Social Determinants of Health   Financial Resource Strain:   . Difficulty of Paying Living Expenses:   Food Insecurity:   . Worried About Programme researcher, broadcasting/film/video in the Last Year:   . Barista in the Last Year:   Transportation Needs:   . Freight forwarder (Medical):   Marland Kitchen Lack of Transportation (Non-Medical):   Physical Activity:   . Days of Exercise per Week:   . Minutes of Exercise per Session:   Stress:   . Feeling of Stress :   Social Connections:   . Frequency of Communication with Friends and Family:   . Frequency of Social Gatherings with Friends and Family:   . Attends Religious Services:   . Active Member of Clubs or Organizations:   . Attends Banker Meetings:   Marland Kitchen Marital Status:   Intimate Partner Violence:   . Fear of Current or Ex-Partner:   . Emotionally Abused:   Marland Kitchen Physically Abused:   . Sexually Abused:     Family History  Problem Relation Age of Onset  . Healthy Sister      Review of Systems  Constitutional: Negative.  Negative for chills and fever.  HENT: Negative.  Negative for congestion and sore throat.   Respiratory:  Negative.  Negative for cough and shortness of breath.   Cardiovascular: Negative.  Negative for chest pain and palpitations.  Gastrointestinal: Negative.  Negative for abdominal pain, blood in stool, diarrhea, nausea and vomiting.  Genitourinary: Negative.  Negative for dysuria and hematuria.  Musculoskeletal: Negative for back pain, myalgias and neck pain.  Skin: Negative.  Negative for rash.       Chronic fungal infection right big toe  Neurological: Negative.  Negative for dizziness and headaches.  Endo/Heme/Allergies: Negative.   All other systems reviewed and are negative.    Physical Exam Vitals reviewed.  Constitutional:      Appearance: Normal appearance.  HENT:     Head: Normocephalic.      Mouth/Throat:     Mouth: Mucous membranes are moist.     Pharynx: Oropharynx is clear.  Eyes:     Extraocular Movements: Extraocular movements intact.     Pupils: Pupils are equal, round, and reactive to light.  Cardiovascular:     Rate and Rhythm: Normal rate and regular rhythm.     Pulses: Normal pulses.     Heart sounds: Normal heart sounds.  Pulmonary:     Effort: Pulmonary effort is normal.     Breath sounds: Normal breath sounds.  Musculoskeletal:        General: Normal range of motion.     Cervical back: Normal range of motion and neck supple.  Skin:    General: Skin is warm and dry.     Capillary Refill: Capillary refill takes less than 2 seconds.     Comments: Right big toe: Onychomycosis  Neurological:     General: No focal deficit present.     Mental Status: She is alert and oriented to person, place, and time.  Psychiatric:        Mood and Affect: Mood normal.        Behavior: Behavior normal.    A total of 30 minutes was spent with the patient, greater than 50% of which was in counseling/coordination of care regarding chronic medical conditions, review of all medications, diet and nutrition, review of most recent office visit notes, review of most recent blood work results, prognosis and need for follow-up.   ASSESSMENT & PLAN: Clinically stable.  No medical concerns identified during this visit.  Continue present medication.  No changes.  Chrystina was seen today for thyroid disorder.  Diagnoses and all orders for this visit:  Thyroid disorder -     Comprehensive metabolic panel -     TSH  Dyslipidemia -     Comprehensive metabolic panel -     Lipid panel  Screening for colon cancer -     Cologuard  Onychomycosis of right great toe -     Ambulatory referral to Podiatry  Encounter for screening mammogram for malignant neoplasm of breast -     MM Digital Screening; Future     Patient Instructions  Mantenimiento de la salud en las mujeres Health  Maintenance, Female Adoptar un estilo de vida saludable y recibir atencin preventiva son importantes para promover la salud y Counsellor. Consulte al mdico sobre:  El esquema adecuado para hacerse pruebas y exmenes peridicos.  Cosas que puede hacer por su cuenta para prevenir enfermedades y [employer]. Qu debo saber sobre la dieta, el peso y el ejercicio? Consuma una dieta saludable   Consuma una dieta que incluya muchas verduras, frutas, productos lcteos con bajo contenido de [place] y Associate Professor.  No consuma  muchos alimentos ricos en grasas slidas, azcares agregados o sodio. Mantenga un peso saludable El ndice de masa muscular [facility]) se [place] para identificar problemas de [place]. Proporciona una estimacin de la grasa corporal basndose en el peso y la altura. Su mdico puede ayudarle a Engineer, site IMC y a Personnel officer o Pharmacologist un peso saludable. Haga ejercicio con regularidad Haga ejercicio con regularidad. Esta es una de las prcticas ms importantes que puede hacer por su salud. La mayora de los adultos deben seguir estas pautas:  Education officer, environmental, al menos, de actividad fsica por semana. El ejercicio debe aumentar la frecuencia cardaca y Media planner transpirar (ejercicio de intensidad moderada).  Hacer ejercicios de fortalecimiento por lo [employer] por semana. Agregue esto a su plan de ejercicio de intensidad moderada.  Pasar menos tiempo sentados. Incluso la actividad fsica ligera puede ser beneficiosa. Controle sus niveles de colesterol y lpidos en la sangre Comience a realizarse anlisis de lpidos y Oncologist en la sangre a los 20aos y luego reptalos cada 5aos. Hgase controlar los niveles de colesterol con mayor frecuencia si:  Sus niveles de lpidos y colesterol son altos.  Es mayor de 40aos.  Presenta un alto riesgo de padecer enfermedades cardacas. Qu debo saber sobre las pruebas de deteccin del cncer? Segn su historia clnica  y sus antecedentes familiares, es posible que deba realizarse pruebas de deteccin del cncer en diferentes edades. Esto puede incluir pruebas de deteccin de lo siguiente:  Cncer de mama.  Cncer de cuello uterino.  Cncer colorrectal.  Cncer de piel.  Cncer de pulmn. Qu debo saber sobre la enfermedad cardaca, la diabetes y la hipertensin arterial? Presin arterial y enfermedad cardaca  La hipertensin arterial causa enfermedades cardacas y [place] el riesgo de accidente cerebrovascular. Es ms probable que esto se manifieste en las personas que tienen lecturas de presin arterial alta, tienen ascendencia africana o tienen sobrepeso.  Hgase controlar la presin arterial: ? Cada 3 a 5 aos si tiene entre 18 y 39 aos. ? Todos los aos si es mayor de [place]. Diabetes Realcese exmenes de deteccin de la diabetes con regularidad. Este anlisis revisa el nivel de azcar en la sangre en [place]. Hgase las pruebas de deteccin:  Cada tresaos despus de los 40aos de edad si tiene un peso normal y un bajo riesgo de padecer diabetes.  Con ms frecuencia y a partir de [place] edad inferior si tiene sobrepeso o un alto riesgo de padecer diabetes. Qu debo saber sobre la prevencin de infecciones? Hepatitis B Si tiene un riesgo ms alto de contraer hepatitis B, debe someterse a un examen de deteccin de este virus. Hable con el mdico para averiguar si tiene riesgo de contraer la infeccin por hepatitis B. Hepatitis C Se recomienda el anlisis a:  [employer] 1945 y 1965.  Todas las personas que tengan un riesgo de haber contrado hepatitis C. Enfermedades de transmisin sexual (ETS)  Hgase las pruebas de Airline pilot de ITS, incluidas la gonorrea y la clamidia, si: ? Es sexualmente activa y es menor de [place]. ? Es mayor de 24aos, y Public affairs consultant informa que corre riesgo de tener este tipo de infecciones. ? La actividad sexual ha cambiado desde que le  hicieron la ltima prueba de deteccin y tiene un riesgo mayor de Warehouse manager clamidia o Copy. Pregntele al mdico si usted tiene riesgo.  Pregntele al mdico si usted tiene un alto riesgo de Primary school teacher VIH. El mdico tambin puede recomendarle un medicamento recetado para  ayudar a evitar la infeccin por el VIH. Si elige tomar medicamentos para prevenir el VIH, primero debe [employer] de deteccin del VIH. Luego debe hacerse anlisis cada 63meses mientras est tomando los medicamentos. Embarazo  Si est por dejar de Librarian, academic (fase premenopusica) y usted puede quedar [place], busque asesoramiento antes de [place].  Tome de 400 a [id number] (mcg) de cido [employer] si [place].  Pida mtodos de control de la natalidad (anticonceptivos) si desea evitar un embarazo no deseado. Osteoporosis y [place] La osteoporosis es una enfermedad en la que los huesos pierden los minerales y la fuerza por el avance de la edad. El resultado pueden ser fracturas en los [place]. Si tiene 65aos o ms, o si est en riesgo de sufrir osteoporosis y fracturas, pregunte a su mdico si debe:  Hacerse pruebas de deteccin de prdida sea.  Tomar un suplemento de calcio o de vitamina D para reducir el riesgo de fracturas.  Recibir terapia de reemplazo hormonal (TRH) para tratar los sntomas de la menopausia. Siga estas instrucciones en su casa: Estilo de vida  No consuma ningn producto que contenga nicotina o tabaco, como cigarrillos, cigarrillos electrnicos y tabaco de Higher education careers adviser. Si necesita ayuda para dejar de fumar, consulte al mdico.  No consuma drogas.  No comparta agujas.  Solicite ayuda a su mdico si necesita apoyo o informacin para abandonar las drogas. Consumo de alcohol  No beba alcohol si: ? Su mdico le indica no hacerlo. ? Est embarazada, puede estar embarazada o est tratando de quedar embarazada.  Si bebe alcohol: ? Limite la cantidad que  consume de 0 a 1 medida por da. ? Limite la ingesta si est amamantando.  Est atento a la cantidad de alcohol que hay en las bebidas que toma. En los [place], una medida equivale a una botella de cerveza de 12oz (381ml), un vaso de vino de 5oz (176ml) o un vaso de una bebida alcohlica de alta graduacin de 1oz (20ml). Instrucciones generales  Realcese los estudios de rutina de la salud, dentales y de Public librarian.  Flasher.  Infrmele a su mdico si: ? Se siente deprimida con frecuencia. ? Alguna vez ha sido vctima de [place] o no se siente segura en su casa. Resumen  Adoptar un estilo de vida saludable y recibir atencin preventiva son importantes para promover la salud y Musician.  Siga las instrucciones del mdico acerca de una dieta saludable, el ejercicio y la realizacin de pruebas o exmenes para Engineer, building services.  Siga las instrucciones del mdico con respecto al control del colesterol y la presin arterial. Esta informacin no tiene Marine scientist el consejo del mdico. Asegrese de hacerle al mdico cualquier pregunta que tenga. Document Revised: 07/27/2018 Document Reviewed: 07/27/2018 Elsevier Patient Education  2020 Elsevier Inc.      Agustina Caroli, MD [facility]

## 2019-11-06 NOTE — Addendum Note (Signed)
Addended by: Evie Lacks on: 11/06/2019 03:19 PM   Modules accepted: Orders

## 2019-11-07 ENCOUNTER — Encounter: Payer: Self-pay | Admitting: Emergency Medicine

## 2019-11-07 ENCOUNTER — Telehealth: Payer: Self-pay | Admitting: Emergency Medicine

## 2019-11-07 LAB — COMPREHENSIVE METABOLIC PANEL
ALT: 45 IU/L — ABNORMAL HIGH (ref 0–32)
AST: 36 IU/L (ref 0–40)
Albumin/Globulin Ratio: 1.7 (ref 1.2–2.2)
Albumin: 4.7 g/dL (ref 3.8–4.9)
Alkaline Phosphatase: 75 IU/L (ref 39–117)
BUN/Creatinine Ratio: 18 (ref 9–23)
BUN: 12 mg/dL (ref 6–24)
Bilirubin Total: 0.2 mg/dL (ref 0.0–1.2)
CO2: 25 mmol/L (ref 20–29)
Calcium: 9.6 mg/dL (ref 8.7–10.2)
Chloride: 103 mmol/L (ref 96–106)
Creatinine, Ser: 0.67 mg/dL (ref 0.57–1.00)
GFR calc Af Amer: 114 mL/min/{1.73_m2} (ref >59)
GFR calc non Af Amer: 99 mL/min/{1.73_m2} (ref >59)
Globulin, Total: 2.7 g/dL (ref 1.5–4.5)
Glucose: 91 mg/dL (ref 65–99)
Potassium: 4.3 mmol/L (ref 3.5–5.2)
Sodium: 141 mmol/L (ref 134–144)
Total Protein: 7.4 g/dL (ref 6.0–8.5)

## 2019-11-07 LAB — LIPID PANEL
Chol/HDL Ratio: 4.9 ratio — ABNORMAL HIGH (ref 0.0–4.4)
Cholesterol, Total: 242 mg/dL — ABNORMAL HIGH (ref 100–199)
HDL: 49 mg/dL (ref >39)
LDL Chol Calc (NIH): 158 mg/dL — ABNORMAL HIGH (ref 0–99)
Triglycerides: 191 mg/dL — ABNORMAL HIGH (ref 0–149)
VLDL Cholesterol Cal: 35 mg/dL (ref 5–40)

## 2019-11-07 LAB — TSH: TSH: 9.94 u[IU]/mL — ABNORMAL HIGH (ref 0.450–4.500)

## 2019-11-07 NOTE — Telephone Encounter (Signed)
Attempted to call about blood results but no answer.

## 2019-11-07 NOTE — Progress Notes (Signed)
FYI - not sure why these labs came to me

## 2019-11-20 LAB — COLOGUARD: COLOGUARD: NEGATIVE

## 2019-11-21 ENCOUNTER — Encounter: Payer: Self-pay | Admitting: Emergency Medicine

## 2019-11-21 LAB — COLOGUARD: Cologuard: NEGATIVE

## 2019-12-07 ENCOUNTER — Encounter: Payer: Self-pay | Admitting: *Deleted

## 2019-12-08 ENCOUNTER — Other Ambulatory Visit: Payer: Self-pay | Admitting: Emergency Medicine

## 2019-12-08 DIAGNOSIS — Z1231 Encounter for screening mammogram for malignant neoplasm of breast: Secondary | ICD-10-CM

## 2019-12-19 ENCOUNTER — Ambulatory Visit: Payer: 59 | Admitting: Nurse Practitioner

## 2019-12-19 DIAGNOSIS — Z0289 Encounter for other administrative examinations: Secondary | ICD-10-CM

## 2019-12-25 ENCOUNTER — Other Ambulatory Visit: Payer: Self-pay

## 2019-12-25 ENCOUNTER — Ambulatory Visit
Admission: RE | Admit: 2019-12-25 | Discharge: 2019-12-25 | Disposition: A | Discharge status: Home / Self Care | Payer: 59 | Source: Ambulatory Visit | Attending: Emergency Medicine | Admitting: Emergency Medicine

## 2019-12-25 DIAGNOSIS — Z1231 Encounter for screening mammogram for malignant neoplasm of breast: Secondary | ICD-10-CM

## 2019-12-26 ENCOUNTER — Ambulatory Visit (INDEPENDENT_AMBULATORY_CARE_PROVIDER_SITE_OTHER): Payer: 59 | Admitting: Nurse Practitioner

## 2019-12-26 ENCOUNTER — Encounter: Payer: Self-pay | Admitting: Nurse Practitioner

## 2019-12-26 ENCOUNTER — Other Ambulatory Visit: Payer: Self-pay

## 2019-12-26 VITALS — BP 100/68 | Ht 64.0 in | Wt 191.0 lb

## 2019-12-26 DIAGNOSIS — Z01419 Encounter for gynecological examination (general) (routine) without abnormal findings: Secondary | ICD-10-CM | POA: Diagnosis not present

## 2019-12-26 DIAGNOSIS — Z780 Asymptomatic menopausal state: Secondary | ICD-10-CM | POA: Diagnosis not present

## 2019-12-26 NOTE — Patient Instructions (Addendum)
Schedule Colonoscopy [facility] [phone] [address] [place], [place] [address]   La menopausia Menopause La menopausia es el perodo normal de la vida en el que los perodos menstruales cesan por completo. Por lo general, se confirma tras de no haber tenido un perodo menstrual. La transicin a la menopausia (perimenopausia), con mayor frecuencia, ocurre entre los 45 y los 55aos. Durante la perimenopausia, los niveles hormonales cambian en el [place], lo que puede provocar sntomas y Ship broker. La menopausia podra aumentar el riesgo de sufrir lo siguiente:  Prdida de masa sea (osteoporosis), lo que predispone a la rotura de huesos (fracturas).  Depresin.  Endurecimiento y Scientist, research (medical) de las arterias (aterosclerosis), lo que puede causar infartos de miocardio y accidentes cerebrovasculares. Cules son las causas? A menudo, la causa de esta afeccin es un cambio natural en los niveles hormonales, que ocurre a medida que envejece. La afeccin tambin podra ser provocada por una ciruga en la que se extraigan ambos ovarios (ooforectoma bilateral). Qu incrementa el riesgo? Es ms probable que esta afeccin comience a una temprana edad si tiene ciertas afecciones o se realiza ciertos tratamientos, incluidos los siguientes:  Un tumor en la hipfisis del cerebro.  Una enfermedad que afecte los ovarios y la produccin de hormonas.  Radioterapia para tratar Management consultant.  Ciertos tratamientos contra el cncer, como quimioterapia o terapia hormonal (antiestrgeno).  Fumar mucho y consumir alcohol de forma excesiva.  Antecedentes familiares de menopausia temprana. Adems, es ms probable que esta afeccin se presente de manera temprana en las mujeres que son [place]. Cules son los signos o los sntomas? Los sntomas de esta afeccin incluyen los siguientes:  Engineer, maintenance.  Perodos menstruales irregulares.  Sudoracin nocturna.  Cambios en el  sentimiento respecto de las [employer]. Es posible que el deseo sexual disminuya o que se sienta ms cmoda respecto de su sexualidad.  Sequedad vaginal y adelgazamiento de las paredes de la vagina. Esto podra hacer que sienta dolor durante las relaciones sexuales.  Sequedad de la piel y aparicin de Banker.  Dolores de [place].  Problemas para dormir (insomnio).  Cambios de humor o irritabilidad.  Problemas de memoria.  Aumento de [place].  Crecimiento de bello en la cara y el pecho.  Infecciones en la vejiga o problemas para orinar. Cmo se diagnostica? Esta afeccin se diagnostica en funcin de los antecedentes mdicos, un examen fsico, la edad, los antecedentes menstruales y los sntomas. Tambin le podran realizar estudios hormonales. Cmo se trata? En algunos los casos, no se necesita tratamiento. Usted y el mdico deben decidir juntos si se debe Pensions consultant. El tratamiento se determinar en funcin de su cuadro clnico y de sus preferencias. El tratamiento de este cuadro clnico se centra en el control de los sntomas. El tratamiento puede incluir lo siguiente:  Terapia hormonal para la menopausia.  Medicamentos para tratar sntomas o complicaciones especficos.  Acupuntura.  Vitaminas o suplementos herbales. Antes de Microbiologist, es importante que le avise al mdico si tiene antecedentes personales o familiares de lo siguiente:  Enfermedades cardacas.  Cncer de mama.  Cogulos de [place].  Diabetes.  Osteoporosis. Siga estas indicaciones en su casa: Estilo de vida  No consuma ningn producto que contenga nicotina o tabaco, como cigarrillos y Administrator, Civil Service. Si necesita ayuda para dejar de fumar, consulte al mdico.  Realice, por lo menos, de actividad fsica 5das por semana o ms.  Evite las bebidas con alcohol o cafena, as como las  comidas muy condimentadas. Esto podra ayudar a prevenir los  acaloramientos.  Intente dormir de 7 a 8horas todas las noches.  Si tiene acaloramientos: ? Vstase en capas. ? Evite las cosas que podran [employer] acaloramientos, como las comidas muy condimentadas, los lugares calientes o el estrs. ? Respire profundamente y despacio cuando comience a Scientist, water quality. ? Tenga un ventilador en su casa y en su oficina.  Encuentre modos de [employer], por ejemplo, a travs de la respiracin, meditacin o un diario ntimo.  Considere la posibilidad de asistir a terapia grupal con otras mujeres que tengan sntomas de [place]. [place] recomendaciones al [employer] reuniones de terapia grupal. Comida y bebida  Siga una dieta saludable y equilibrada que incluya cereales integrales, protenas magras, productos lcteos descremados y [place] frutas y verduras.  El mdico podra recomendarle que agregue una mayor cantidad de soja a su dieta. Algunos de los alimentos que contienen soja son el tofu, el tempeh y la [place] de soja.  Consuma muchos alimentos que contengan calcio y vitaminaD para Nutritional therapist salud sea. Algunos productos que contienen mucho calcio son los [employer] Products, el yogur, los frijoles, las [place], las sardinas, el brcoli y la col rizada. Medicamentos  [employer] de venta libre y los recetados solamente como se lo haya indicado el mdico.  Hable con el mdico antes de Corporate investment banker a tomar cualquier suplemento herbal. Tome las vitaminas y los suplementos recetados como se lo haya indicado el mdico. Estos pueden incluir lo siguiente: ? Calcio. Las mujeres de 51aos o ms deben consumir 1200mg  (miligramos) de . ? VitaminaD. Las mujeres necesitan entre 600 y 800unidades internacionales de vitaminaD todos [place]. ? VitaminasB12 yB6. Procure consumir [place] de vitaminaB12 y 1,5mg  de vitaminaB6 todos los . Instrucciones generales  Lleve un registro de sus perodos  menstruales; incluya lo siguiente: ? El momento en que ocurren. ? Qu tan abundantes son y cunto duran. ? Cunto tiempo transcurre entre cada perodo menstrual.  Lleve un registro de los sntomas; anote cundo comienzan, con qu frecuencia ocurren y cunto duran.  Use lubricantes o humectantes vaginales para aliviar la sequedad vaginal y [address] durante las relaciones sexuales.  Concurra a todas las visitas de control como se lo haya indicado el mdico. Esto es importante. Esto incluye la terapia grupal y la psicoterapia. Comunquese con un mdico si:  An tiene perodos menstruales despus de los 55aos.  Siente dolor durante las Personnel officer.  No tuvo perodos menstruales durante los ltimos [employer] y presenta sangrado vaginal. Solicite ayuda de inmediato si:  Tiene los siguientes sntomas: ? Depresin grave. ? Sangrado vaginal excesivo. ? Dolor al . ? Latidos cardacos rpidos o irregulares (palpitaciones). ? Dolor de cabeza intenso. ? Dolor en el abdomen (abdominal) o dispepsia grave.  Se cay y cree que se ha fracturado un hueso.  Siente dolor en el pecho o en la pierna.  Desarrolla problemas de visin.  Se toca un bulto en la mama. Resumen  La menopausia es el perodo normal de la vida en el que los perodos menstruales cesan por completo. Por lo general, se confirma tras [employer] de no haber tenido un perodo menstrual.  La transicin a la menopausia (perimenopausia), con mayor frecuencia, ocurre entre los 45 y los 55aos.  Los sntomas pueden controlarse mediante medicamentos, cambios en el estilo de vida y terapias complementarias, como acupuntura.  Siga una dieta equilibrada que incluya muchos nutrientes para 06-25-1980 salud sea y la  salud cardaca, y para Chief Operating Officer los sntomas durante la menopausia. Esta informacin no tiene Theme park manager el consejo del mdico. Asegrese de hacerle al mdico cualquier pregunta que  tenga. Document Revised: 04/01/2017 Document Reviewed: 04/01/2017 Elsevier Patient Education  2020 [employer]. Mantenimiento de la salud en las mujeres Health Maintenance, Female Adoptar un estilo de vida saludable y recibir atencin preventiva son importantes para promover la salud y Counsellor. Consulte al mdico sobre:  El esquema adecuado para hacerse pruebas y exmenes peridicos.  Cosas que puede hacer por su cuenta para prevenir enfermedades y [employer]. Qu debo saber sobre la dieta, el peso y el ejercicio? Consuma una dieta saludable   Consuma una dieta que incluya muchas verduras, frutas, productos lcteos con bajo contenido de [place] y Associate Professor.  No consuma muchos alimentos ricos en grasas slidas, azcares agregados o sodio. Mantenga un peso saludable El ndice de masa muscular [facility]) se [place] para identificar problemas de [place]. Proporciona una estimacin de la grasa corporal basndose en el peso y la altura. Su mdico puede ayudarle a Engineer, site IMC y a Personnel officer o Pharmacologist un peso saludable. Haga ejercicio con regularidad Haga ejercicio con regularidad. Esta es una de las prcticas ms importantes que puede hacer por su salud. La mayora de los adultos deben seguir estas pautas:  Education officer, environmental, al menos, de actividad fsica por semana. El ejercicio debe aumentar la frecuencia cardaca y Media planner transpirar (ejercicio de intensidad moderada).  Hacer ejercicios de fortalecimiento por lo [employer] por semana. Agregue esto a su plan de ejercicio de intensidad moderada.  Pasar menos tiempo sentados. Incluso la actividad fsica ligera puede ser beneficiosa. Controle sus niveles de colesterol y lpidos en la sangre Comience a realizarse anlisis de lpidos y Oncologist en la sangre a los 20aos y luego reptalos cada 5aos. Hgase controlar los niveles de colesterol con mayor frecuencia si:  Sus niveles de lpidos y colesterol son altos.  Es  mayor de 40aos.  Presenta un alto riesgo de padecer enfermedades cardacas. Qu debo saber sobre las pruebas de deteccin del cncer? Segn su historia clnica y sus antecedentes familiares, es posible que deba realizarse pruebas de deteccin del cncer en diferentes edades. Esto puede incluir pruebas de deteccin de lo siguiente:  Cncer de mama.  Cncer de cuello uterino.  Cncer colorrectal.  Cncer de piel.  Cncer de pulmn. Qu debo saber sobre la enfermedad cardaca, la diabetes y la hipertensin arterial? Presin arterial y enfermedad cardaca  La hipertensin arterial causa enfermedades cardacas y [place] el riesgo de accidente cerebrovascular. Es ms probable que esto se manifieste en las personas que tienen lecturas de presin arterial alta, tienen ascendencia africana o tienen sobrepeso.  Hgase controlar la presin arterial: ? Cada 3 a 5 aos si tiene entre 18 y 14 aos. ? Todos los aos si es mayor de [place]. Diabetes Realcese exmenes de deteccin de la diabetes con regularidad. Este anlisis revisa el nivel de azcar en la sangre en [place]. Hgase las pruebas de deteccin:  Cada tresaos despus de los 40aos de edad si tiene un peso normal y un bajo riesgo de padecer diabetes.  Con ms frecuencia y a partir de [place] edad inferior si tiene sobrepeso o un alto riesgo de padecer diabetes. Qu debo saber sobre la prevencin de infecciones? Hepatitis B Si tiene un riesgo ms alto de contraer hepatitis B, debe someterse a un examen de deteccin de este virus. Hable con el mdico para averiguar si tiene [employer]  de contraer la infeccin por hepatitis B. Hepatitis C Se recomienda el anlisis a:  [employer] 1945 y 1965.  Todas las personas que tengan un riesgo de haber contrado hepatitis C. Enfermedades de transmisin sexual (ETS)  Hgase las pruebas de Programme researcher, broadcasting/film/video de ITS, incluidas la gonorrea y la clamidia, si: ? Es sexualmente activa y es  menor de [place]. ? Es mayor de 24aos, y Investment banker, operational informa que corre riesgo de tener este tipo de infecciones. ? La actividad sexual ha cambiado desde que le hicieron la ltima prueba de deteccin y tiene un riesgo mayor de Best boy clamidia o Radio broadcast assistant. Pregntele al mdico si usted tiene riesgo.  Pregntele al mdico si usted tiene un alto riesgo de Museum/gallery curator VIH. El mdico tambin puede recomendarle un medicamento recetado para ayudar a evitar la infeccin por el VIH. Si elige tomar medicamentos para prevenir el VIH, primero debe [employer] Pride de deteccin del VIH. Luego debe hacerse anlisis cada 55meses mientras est tomando los medicamentos. Embarazo  Si est por dejar de Librarian, academic (fase premenopusica) y usted puede quedar [place], busque asesoramiento antes de [place].  Tome de 400 a [id number] (mcg) de cido [employer] si [place].  Pida mtodos de control de la natalidad (anticonceptivos) si desea evitar un embarazo no deseado. Osteoporosis y [place] La osteoporosis es una enfermedad en la que los huesos pierden los minerales y la fuerza por el avance de la edad. El resultado pueden ser fracturas en los [place]. Si tiene 65aos o ms, o si est en riesgo de sufrir osteoporosis y fracturas, pregunte a su mdico si debe:  Hacerse pruebas de deteccin de prdida sea.  Tomar un suplemento de calcio o de vitamina D para reducir el riesgo de fracturas.  Recibir terapia de reemplazo hormonal (TRH) para tratar los sntomas de la menopausia. Siga estas instrucciones en su casa: Estilo de vida  No consuma ningn producto que contenga nicotina o tabaco, como cigarrillos, cigarrillos electrnicos y tabaco de Higher education careers adviser. Si necesita ayuda para dejar de fumar, consulte al mdico.  No consuma drogas.  No comparta agujas.  Solicite ayuda a su mdico si necesita apoyo o informacin para abandonar las drogas. Consumo de alcohol  No beba alcohol  si: ? Su mdico le indica no hacerlo. ? Est embarazada, puede estar embarazada o est tratando de quedar embarazada.  Si bebe alcohol: ? Limite la cantidad que consume de 0 a 1 medida por da. ? Limite la ingesta si est amamantando.  Est atento a la cantidad de alcohol que hay en las bebidas que toma. En los [place], una medida equivale a una botella de cerveza de 12oz (353ml), un vaso de vino de 5oz (197ml) o un vaso de una bebida alcohlica de alta graduacin de 1oz (16ml). Instrucciones generales  Realcese los estudios de rutina de la salud, dentales y de Public librarian.  [place].  Infrmele a su mdico si: ? Se siente deprimida con frecuencia. ? Alguna vez ha sido vctima de [place] o no se siente segura en su casa. Resumen  Adoptar un estilo de vida saludable y recibir atencin preventiva son importantes para promover la salud y Musician.  Siga las instrucciones del mdico acerca de una dieta saludable, el ejercicio y la realizacin de pruebas o exmenes para Engineer, building services.  Siga las instrucciones del mdico con respecto al control del colesterol y la presin arterial. Esta informacin no tiene Marine scientist  el consejo del mdico. Asegrese de hacerle al mdico cualquier pregunta que tenga. Document Revised: 07/27/2018 Document Reviewed: 07/27/2018 Elsevier Patient Education  [place].

## 2019-12-26 NOTE — Progress Notes (Signed)
° °  Tracey Campbell [birth year] [id number]   History:  54 y.o. MHF G3 P3 presents as new patient for annual exam without GYN complaints. Postmenopausal since 2019-no HRT, no bleeding. Minimal menopausal symptoms. Complains of difficulty losing weight, eats healthy but does not exercise. Has an active job that requires a lot of walking.  HLD and hypothyroidism managed by PCP.   Gynecologic History Contraception: post menopausal status Last Pap: UNK Last mammogram: 12/25/2019. Results were: normal Last colonoscopy: never   Past medical history, past surgical history, family history and social history were all reviewed and documented in the EPIC chart.Works at [employer].   ROS:  A ROS was performed and pertinent positives and negatives are included.  Exam:  Vitals:   12/26/19 1555  BP: 100/68  Weight: 191 lb (86.6 kg)  Height: 5\' 4"  (1.626 m)   Body mass index is 32.79 kg/m.  General appearance:  Normal Thyroid:  Symmetrical, normal in size, without palpable masses or nodularity. Respiratory  Auscultation:  Clear without wheezing or rhonchi Cardiovascular  Auscultation:  Regular rate, without rubs, murmurs or gallops  Edema/varicosities:  Not grossly evident Abdominal  Soft,nontender, without masses, guarding or rebound.  Liver/spleen:  No organomegaly noted  Hernia:  None appreciated  Skin  Inspection:  Grossly normal   Breasts: Examined lying and sitting.   Right: Without masses, retractions, discharge or axillary adenopathy.   Left: Without masses, retractions, discharge or axillary adenopathy. Gentitourinary   Inguinal/mons:  Normal without inguinal adenopathy  External genitalia:  Normal  BUS/Urethra/Skene's glands:  Normal  Vagina:  Normal  Cervix:  Normal  Uterus:  Normal in size, shape and contour.  Midline and mobile  Adnexa/parametria:     Rt: Without masses or tenderness.   Lt: Without masses or tenderness.  Anus and perineum: Normal  Digital rectal  exam: Normal sphincter tone without palpated masses or tenderness  Assessment/Plan:  54 y.o. MHF G3 P3 for annual exam.    Well female exam with routine gynecological exam - Plan: PAP,TP IMGw/HPV RNA,rflx HPVTYPE16,18/45. Education provided on SBEs, importance of preventative screenings, current guidelines, high calcium diet, regular exercise, and multivitamin daily. Discussed weight loss management with low carb/low calorie diet and increasing exercise to 3-5 days a week for 30-60 minutes.   Menopause - minimal symptoms, no HRT, no bleeding  Follow up in 1 year for annual       [address] [facility], 8:55 PM 12/26/2019

## 2019-12-27 ENCOUNTER — Encounter: Payer: Self-pay | Admitting: Emergency Medicine

## 2019-12-27 LAB — PAP, TP IMAGING W/ HPV RNA, RFLX HPV TYPE 16,18/45: HPV DNA High Risk: NOT DETECTED

## 2019-12-28 ENCOUNTER — Ambulatory Visit: Payer: 59 | Admitting: Podiatry

## 2020-04-03 ENCOUNTER — Encounter: Payer: Self-pay | Admitting: Emergency Medicine

## 2020-04-03 ENCOUNTER — Ambulatory Visit (INDEPENDENT_AMBULATORY_CARE_PROVIDER_SITE_OTHER): Payer: 59 | Admitting: Emergency Medicine

## 2020-04-03 ENCOUNTER — Other Ambulatory Visit: Payer: Self-pay

## 2020-04-03 VITALS — BP 117/78 | HR 79 | Temp 97.8°F | Resp 16 | Ht 64.0 in | Wt 190.0 lb

## 2020-04-03 DIAGNOSIS — M62838 Other muscle spasm: Secondary | ICD-10-CM

## 2020-04-03 DIAGNOSIS — Z23 Encounter for immunization: Secondary | ICD-10-CM

## 2020-04-03 DIAGNOSIS — R519 Headache, unspecified: Secondary | ICD-10-CM

## 2020-04-03 DIAGNOSIS — Z6832 Body mass index (BMI) 32.0-32.9, adult: Secondary | ICD-10-CM

## 2020-04-03 DIAGNOSIS — M542 Cervicalgia: Secondary | ICD-10-CM | POA: Diagnosis not present

## 2020-04-03 MED ORDER — BUTALBITAL-APAP-CAFFEINE 50-325-40 MG PO TABS: 1.0000 | ORAL_TABLET | Freq: Four times a day (QID) | ORAL | 0 refills | Status: DC | PRN

## 2020-04-03 NOTE — Progress Notes (Signed)
Tracey Campbell 54 y.o.   Chief Complaint  Patient presents with  . Dizziness    per patient started yesterday  . Headache    per patient back of head feels hot and ear pain mostly left ear    HISTORY OF PRESENT ILLNESS: This is a 54 y.o. female complaining of diffuse headache since yesterday morning.  No history of migraines. Headache started yesterday around 8 AM at work, gradual onset.  No associated symptoms.  Went home after lunch and slept some.  Did not take any over-the-counter analgesics.  Some stress at work.  Neck and upper back muscles feel stiff and achy.  Denies flulike symptoms.  Denies fever.  Able to eat and drink.  Denies nausea or vomiting.  Denies visual problems.  Denies sore throat.  No difficulty swallowing.  Denies gait or balance disturbance.  Not on any new medication.  Past medical history includes hypothyroidism and dyslipidemia.  Takes medications on and off, atorvastatin and Synthroid. No other complaints or medical concerns today.  HPI   Prior to Admission medications   Medication Sig Start Date End Date Taking? Authorizing Provider  Omega-3 Fatty Acids (FISH OIL) 1000 MG CPDR Take by mouth daily.   Yes [provider]  atorvastatin (LIPITOR) 40 MG tablet Take 1 tablet (40 mg total) by mouth daily. 05/08/19 12/26/19  Georgina Quint, MD  levothyroxine (SYNTHROID) 125 MCG tablet Take 1 tablet (125 mcg total) by mouth daily before breakfast. 05/08/19 12/26/19  Georgina Quint, MD    No Known Allergies  Patient Active Problem List   Diagnosis Date Noted  . History of high cholesterol 09/28/2018  . Thyroid disorder 09/28/2018    Past Medical History:  Diagnosis Date  . Thyroid disease     Past Surgical History:  Procedure Laterality Date  . CARDIAC VALVE REPLACEMENT      Social History   Socioeconomic History  . Marital status: Married    Spouse name: Not on file  . Number of children: Not on file  . Years of education:  Not on file  . Highest education level: Not on file  Occupational History  . Not on file  Tobacco Use  . Smoking status: Never Smoker  . Smokeless tobacco: Never Used  Substance and Sexual Activity  . Alcohol use: Never  . Drug use: Never  . Sexual activity: Not on file  Other Topics Concern  . Not on file  Social History Narrative  . Not on file   Social Determinants of Health   Financial Resource Strain:   . Difficulty of Paying Living Expenses: Not on file  Food Insecurity:   . Worried About Programme researcher, broadcasting/film/video in the Last Year: Not on file  . Ran Out of Food in the Last Year: Not on file  Transportation Needs:   . Lack of Transportation (Medical): Not on file  . Lack of Transportation (Non-Medical): Not on file  Physical Activity:   . Days of Exercise per Week: Not on file  . Minutes of Exercise per Session: Not on file  Stress:   . Feeling of Stress : Not on file  Social Connections:   . Frequency of Communication with Friends and Family: Not on file  . Frequency of Social Gatherings with Friends and Family: Not on file  . Attends Religious Services: Not on file  . Active Member of Clubs or Organizations: Not on file  . Attends Banker Meetings: Not on file  .  Marital Status: Not on file  Intimate Partner Violence:   . Fear of Current or Ex-Partner: Not on file  . Emotionally Abused: Not on file  . Physically Abused: Not on file  . Sexually Abused: Not on file    Family History  Problem Relation Age of Onset  . Healthy Sister      Review of Systems  Constitutional: Negative.  Negative for chills and fever.  HENT: Negative.  Negative for congestion, ear pain, nosebleeds and sore throat.   Eyes: Negative.  Negative for blurred vision, double vision, photophobia and pain.  Respiratory: Negative.  Negative for cough and shortness of breath.   Cardiovascular: Negative.  Negative for chest pain and palpitations.  Gastrointestinal: Negative.   Negative for abdominal pain, blood in stool, diarrhea, melena, nausea and vomiting.  Genitourinary: Negative.  Negative for hematuria.  Musculoskeletal: Negative.  Negative for myalgias.  Skin: Negative.  Negative for rash.  Neurological: Negative.  Negative for dizziness and headaches.  All other systems reviewed and are negative.  Today's Vitals   04/03/20 1435  BP: 117/78  Pulse: 79  Resp: 16  Temp: 97.8 F (36.6 C)  TempSrc: Temporal  SpO2: 96%  Weight: 190 lb (86.2 kg)  Height: 5\' 4"  (1.626 m)   Body mass index is 32.61 kg/m.   Physical Exam Constitutional:      Appearance: She is well-developed.  HENT:     Head: Normocephalic and atraumatic.     Right Ear: Tympanic membrane, ear canal and external ear normal.     Left Ear: Tympanic membrane, ear canal and external ear normal.     Mouth/Throat:     Mouth: Mucous membranes are moist.     Pharynx: Oropharynx is clear.  Eyes:     Extraocular Movements: Extraocular movements intact.     Conjunctiva/sclera: Conjunctivae normal.     Pupils: Pupils are equal, round, and reactive to light.  Neck:     Vascular: No carotid bruit.  Cardiovascular:     Rate and Rhythm: Normal rate and regular rhythm.     Pulses: Normal pulses.     Heart sounds: Normal heart sounds.  Pulmonary:     Effort: Pulmonary effort is normal.     Breath sounds: Normal breath sounds.  Musculoskeletal:        General: Normal range of motion.     Cervical back: No erythema or crepitus. Pain with movement and muscular tenderness present. No spinous process tenderness.  Lymphadenopathy:     Cervical: No cervical adenopathy.  Skin:    General: Skin is warm and dry.     Capillary Refill: Capillary refill takes less than 2 seconds.  Neurological:     General: No focal deficit present.     Mental Status: She is alert and oriented to person, place, and time.     Cranial Nerves: No cranial nerve deficit.     Sensory: No sensory deficit.     Motor: No  weakness.     Coordination: Coordination normal.     Gait: Gait normal.     Deep Tendon Reflexes: Reflexes normal.  Psychiatric:        Mood and Affect: Mood normal.        Behavior: Behavior normal.    A total of 30 minutes was spent with the patient, greater than 50% of which was in counseling/coordination of care regarding differential diagnosis of headaches, review of all medications, need to take her medications for cholesterol and hypothyroidism  daily, review of most recent blood work results showed increased TSH and abnormal lipid profile, need to rest and stay off work for couple days, diet and nutrition, review of most recent office visit notes, ED precautions, advised on over-the-counter analgesics and prescribed medication Fioricet, prognosis and need for follow-up in the office if no better or worse in the next several days.   ASSESSMENT & PLAN: Clinically stable.  No red flag signs or symptoms.  Advised to contact the office if no better or worse in the next several days. Mishell was seen today for dizziness and headache.  Diagnoses and all orders for this visit:  Generalized headache -     butalbital-acetaminophen-caffeine (FIORICET) 50-325-40 MG tablet; Take 1-2 tablets by mouth every 6 (six) hours as needed for headache.  Musculoskeletal neck pain  Muscle spasm  Need for prophylactic vaccination and inoculation against influenza -     Flu Vaccine QUAD 36+ mos IM  Body mass index (BMI) of 32.0-32.9 in adult    Patient Instructions       If you have lab work done today you will be contacted with your lab results within the next 2 weeks.  If you have not heard from [place] then please contact us. The fastest way to get your results is to register for My Chart.   IF you received an x-ray today, you will receive an invoice from [facility]. Please contact [facility] at [phone] with questions or concerns regarding your invoice.   IF you received  labwork today, you will receive an invoice from [facility]. Please contact [facility] at [phone] with questions or concerns regarding your invoice.   Our billing staff will not be able to assist you with questions regarding bills from these companies.  You will be contacted with the lab results as soon as they are available. The fastest way to get your results is to activate your My Chart account. Instructions are located on the last page of this paperwork. If you have not heard from [place] regarding the results in 2 weeks, please contact this office.     Dolor de cabeza general sin causa General Headache Without Cause El dolor de cabeza es un dolor o malestar que se siente en la zona de la cabeza o del cuello. Hay muchas causas y tipos de dolores de [place]. En algunos casos, es posible que no se encuentre la causa. Siga estas indicaciones en su casa: Controle su afeccin para detectar cualquier cambio. Infrmele a su mdico acerca de los cambios. Siga estos pasos para Facilities manager afeccin: Control del [employer] medicamentos de venta libre y los recetados solamente como se lo haya indicado el mdico.  Cuando sienta dolor de cabeza acustese en un cuarto oscuro y tranquilo.  Si se lo indican, aplquese hielo en la cabeza y en la zona del cuello: ? Ponga el hielo en una bolsa plstica. ? Coloque una [employer] piel y Copy. ? Coloque el hielo durante , 2a3veces al da.  Si se lo indican, aplique calor en la zona afectada. Use la fuente de calor que el mdico le recomiende, como una compresa de calor hmedo o una almohadilla trmica. ? Coloque una [employer] piel y la fuente de Airline pilot. ? Aplique calor durante 20 a . ? Retire la fuente de calor si la piel se pone de color rojo brillante. Esto es muy importante si no puede Financial risk analyst, calor o fro. Puede  correr un riesgo mayor de sufrir quemaduras.  Mantenga las luces tenues si las luces  brillantes le molestan o sus dolores de cabeza [place]. Comida y bebida  Mantenga un horario para las comidas.  Si bebe alcohol: ? Limite la cantidad que bebe a lo siguiente:  De 0 a 1 medida por da para las mujeres.  De 0 a 2 medidas por da para los hombres. ? Est atento a la cantidad de alcohol que hay en las bebidas que toma. En los [place], una medida equivale a una botella de cerveza de 12oz ( ), un vaso de vino de 5oz ( ) o un vaso de una bebida alcohlica de alta graduacin de 1oz (38ml).  Deje de tomar cafena o reduzca la cantidad que consume. Indicaciones generales   Lleve un registro diario para averiguar si ciertas cosas provocan los dolores de [place]. Registre, por ejemplo, lo siguiente: ? Lo que usted come y bebe. ? El tiempo que duerme. ? Algn cambio en su dieta o en los medicamentos.  Hgase masajes o pruebe otras formas de relajarse.  Limite el estrs.  Sintese con la espalda recta. No contraiga (tensione) los msculos.  No consuma ningn producto que contenga nicotina o tabaco. Estos incluyen los cigarrillos, el tabaco para Theatre manager y los Administrator, Civil Service. Si necesita ayuda para dejar de fumar, consulte al mdico.  Haga ejercicios con regularidad tal como se lo indic el mdico.  Duerma lo suficiente. Esto a menudo significa entre 7 y 9horas de sueo cada noche.  Concurra a todas las visitas de control como se lo haya indicado el mdico. Esto es importante. Comunquese con un mdico si:  Los medicamentos no logran [employer].  Tiene un dolor de cabeza que es diferente a los otros dolores de [place].  Tiene malestar estomacal (nuseas) o vomita.  Tiene fiebre. Solicite ayuda inmediatamente si:  El dolor de [place] empeora rpidamente.  El dolor empeora despus de hacer mucha actividad fsica.  Sigue vomitando.  Presenta rigidez en el cuello.  Tiene dificultad para ver.  Tiene dificultad para  hablar.  Siente dolor en el ojo o en el odo.  Sus msculos estn dbiles, o pierde el control muscular.  Pierde el equilibrio o tiene problemas para Advertising account planner.  Siente que va a desvanecerse (perder el conocimiento) o se desmaya.  Est desorientado (confundido).  Tiene una convulsin. Resumen  El dolor de cabeza es un dolor o Dentist que se siente en la zona de la cabeza o del cuello.  Hay muchas causas y tipos de dolores de [place]. En algunos casos, es posible que no se encuentre la causa.  Lleve un diario como ayuda para [employer] causa de los dolores de [place]. Controle su afeccin para Insurance risk surveyor cambio. Infrmele a su mdico acerca de los cambios.  Comunquese con un mdico si tiene un dolor de cabeza que es diferente de lo habitual o si el dolor de cabeza no se alivia con los medicamentos.  Solicite ayuda de inmediato si el dolor de cabeza es muy intenso, vomita, tiene dificultad para ver, pierde el equilibrio o tiene una convulsin. Esta informacin no tiene Theme park manager el consejo del mdico. Asegrese de hacerle al mdico cualquier pregunta que tenga. Document Revised: 03/09/2018 Document Reviewed: 03/09/2018 Elsevier Patient Education  2020 Elsevier Inc.     Edwina Barth, MD [facility]

## 2020-04-03 NOTE — Patient Instructions (Addendum)
If you have lab work done today you will be contacted with your lab results within the next 2 weeks.  If you have not heard from [place] then please contact us. The fastest way to get your results is to register for My Chart.   IF you received an x-ray today, you will receive an invoice from [facility] Rad[facility]. Please contact [facility] Radiology at [phone] with questions or concerns regarding your invoice.   IF you received labwork today, you will receive an invoice from [place]. Please contact [facility] at [phone] with questions or concerns regarding your invoice.   Our billing staff will not be able to assist you with questions regarding bills from these companies.  You will be contacted with the lab results as soon as they are available. The fastest way to get your results is to activate your My Chart account. Instructions are located on the last page of this paperwork. If you have not heard from [place] regarding the results in 2 weeks, please contact this office.     Dolor de cabeza general sin causa General Headache Without Cause El dolor de cabeza es un dolor o malestar que se siente en la zona de la cabeza o del cuello. Hay muchas causas y tipos de dolores de [place]. En algunos casos, es posible que no se encuentre la causa. Siga estas indicaciones en su casa: Controle su afeccin para detectar cualquier cambio. Infrmele a su mdico acerca de los cambios. Siga estos pasos para Facilities manager afeccin: Control del [employer] medicamentos de venta libre y los recetados solamente como se lo haya indicado el mdico.  Cuando sienta dolor de cabeza acustese en un cuarto oscuro y tranquilo.  Si se lo indican, aplquese hielo en la cabeza y en la zona del cuello: ? Ponga el hielo en una bolsa plstica. ? Coloque una [employer] piel y Copy. ? Coloque el hielo durante , 2a3veces al da.  Si se lo indican, aplique calor en la zona afectada. Use  la fuente de calor que el mdico le recomiende, como una compresa de calor hmedo o una almohadilla trmica. ? Coloque una [employer] piel y la fuente de Airline pilot. ? Aplique calor durante 20 a . ? Retire la fuente de calor si la piel se pone de color rojo brillante. Esto es muy importante si no puede Financial risk analyst, calor o fro. Puede correr un riesgo mayor de sufrir quemaduras.  Mantenga las luces tenues si las luces brillantes le molestan o sus dolores de cabeza [place]. Comida y bebida  Mantenga un horario para las comidas.  Si bebe alcohol: ? Limite la cantidad que bebe a lo siguiente:  De 0 a 1 medida por da para las mujeres.  De 0 a 2 medidas por da para los hombres. ? Est atento a la cantidad de alcohol que hay en las bebidas que toma. En los [place], una medida equivale a una botella de cerveza de 12oz ( ), un vaso de vino de 5oz ( ) o un vaso de una bebida alcohlica de alta graduacin de 1oz (71ml).  Deje de tomar cafena o reduzca la cantidad que consume. Indicaciones generales   Lleve un registro diario para averiguar si ciertas cosas provocan los dolores de [place]. Registre, por ejemplo, lo siguiente: ? Lo que usted come y bebe. ? El tiempo que duerme. ? Algn cambio en su dieta o en los medicamentos.  Hgase masajes o  pruebe otras formas de relajarse.  Limite el estrs.  Sintese con la espalda recta. No contraiga (tensione) los msculos.  No consuma ningn producto que contenga nicotina o tabaco. Estos incluyen los cigarrillos, el tabaco para Theatre manager y los Administrator, Civil Service. Si necesita ayuda para dejar de fumar, consulte al mdico.  Haga ejercicios con regularidad tal como se lo indic el mdico.  Duerma lo suficiente. Esto a menudo significa entre 7 y 9horas de sueo cada noche.  Concurra a todas las visitas de control como se lo haya indicado el mdico. Esto es importante. Comunquese con un mdico si:  Los  medicamentos no logran [employer].  Tiene un dolor de cabeza que es diferente a los otros dolores de [place].  Tiene malestar estomacal (nuseas) o vomita.  Tiene fiebre. Solicite ayuda inmediatamente si:  El dolor de [place] empeora rpidamente.  El dolor empeora despus de hacer mucha actividad fsica.  Sigue vomitando.  Presenta rigidez en el cuello.  Tiene dificultad para ver.  Tiene dificultad para hablar.  Siente dolor en el ojo o en el odo.  Sus msculos estn dbiles, o pierde el control muscular.  Pierde el equilibrio o tiene problemas para Advertising account planner.  Siente que va a desvanecerse (perder el conocimiento) o se desmaya.  Est desorientado (confundido).  Tiene una convulsin. Resumen  El dolor de cabeza es un dolor o Dentist que se siente en la zona de la cabeza o del cuello.  Hay muchas causas y tipos de dolores de [place]. En algunos casos, es posible que no se encuentre la causa.  Lleve un diario como ayuda para [employer] causa de los dolores de [place]. Controle su afeccin para Insurance risk surveyor cambio. Infrmele a su mdico acerca de los cambios.  Comunquese con un mdico si tiene un dolor de cabeza que es diferente de lo habitual o si el dolor de cabeza no se alivia con los medicamentos.  Solicite ayuda de inmediato si el dolor de cabeza es muy intenso, vomita, tiene dificultad para ver, pierde el equilibrio o tiene una convulsin. Esta informacin no tiene Theme park manager el consejo del mdico. Asegrese de hacerle al mdico cualquier pregunta que tenga. Document Revised: 03/09/2018 Document Reviewed: 03/09/2018 Elsevier Patient Education  2020 [employer].

## 2020-05-06 ENCOUNTER — Ambulatory Visit: Payer: 59 | Admitting: Emergency Medicine

## 2020-05-08 ENCOUNTER — Encounter: Payer: Self-pay | Admitting: Emergency Medicine

## 2020-05-08 ENCOUNTER — Ambulatory Visit (INDEPENDENT_AMBULATORY_CARE_PROVIDER_SITE_OTHER): Payer: 59 | Admitting: Emergency Medicine

## 2020-05-08 ENCOUNTER — Other Ambulatory Visit: Payer: Self-pay

## 2020-05-08 VITALS — BP 112/72 | HR 69 | Temp 97.9°F | Ht 64.0 in | Wt 189.6 lb

## 2020-05-08 DIAGNOSIS — K644 Residual hemorrhoidal skin tags: Secondary | ICD-10-CM | POA: Diagnosis not present

## 2020-05-08 DIAGNOSIS — E039 Hypothyroidism, unspecified: Secondary | ICD-10-CM | POA: Diagnosis not present

## 2020-05-08 DIAGNOSIS — E785 Hyperlipidemia, unspecified: Secondary | ICD-10-CM

## 2020-05-08 DIAGNOSIS — Z6832 Body mass index (BMI) 32.0-32.9, adult: Secondary | ICD-10-CM

## 2020-05-08 MED ORDER — HYDROCORTISONE (PERIANAL) 2.5 % EX CREA: 1.0000 "application " | TOPICAL_CREAM | Freq: Two times a day (BID) | CUTANEOUS | 0 refills | Status: DC

## 2020-05-08 MED ORDER — ATORVASTATIN CALCIUM 40 MG PO TABS: 40.0000 mg | ORAL_TABLET | Freq: Every day | ORAL | 3 refills | Status: DC

## 2020-05-08 MED ORDER — LEVOTHYROXINE SODIUM 125 MCG PO TABS: 125.0000 ug | ORAL_TABLET | Freq: Every day | ORAL | 3 refills | Status: DC

## 2020-05-08 NOTE — Progress Notes (Signed)
Tracey Campbell 54 y.o.   Chief Complaint  Patient presents with  . Medical Management of Chronic Issues    6 m f/u     HISTORY OF PRESENT ILLNESS: This is a 54 y.o. female with history of hypothyroidism and dyslipidemia here for follow-up and medication refill. 1.  Dyslipidemia: On atorvastatin 40 mg daily 2.  Hypothyroidism: On Synthroid 125 mcg daily. Fully vaccinated against Covid. Complaining of occasional rectal itching secondary to external hemorrhoids. No other complaints or medical concerns today.  HPI   Prior to Admission medications   Medication Sig Start Date End Date Taking? Authorizing Provider  atorvastatin (LIPITOR) 40 MG tablet Take 1 tablet (40 mg total) by mouth daily. 05/08/20 08/06/20 Yes SagardiaInes Bloomer, MD  levothyroxine (SYNTHROID) 125 MCG tablet Take 1 tablet (125 mcg total) by mouth daily before breakfast. 05/08/20 08/06/20 Yes Monetta Lick, Ines Bloomer, MD  Omega-3 Fatty Acids (FISH OIL) 1000 MG CPDR Take by mouth daily.   Yes [provider]  hydrocortisone (ANUSOL-HC) 2.5 % rectal cream Place 1 application rectally 2 (two) times daily. 05/08/20   Horald Pollen, MD    No Known Allergies  Patient Active Problem List   Diagnosis Date Noted  . History of high cholesterol 09/28/2018  . Thyroid disorder 09/28/2018    Past Medical History:  Diagnosis Date  . Thyroid disease     Past Surgical History:  Procedure Laterality Date  . CARDIAC VALVE REPLACEMENT      Social History   Socioeconomic History  . Marital status: Married    Spouse name: Not on file  . Number of children: Not on file  . Years of education: Not on file  . Highest education level: Not on file  Occupational History  . Not on file  Tobacco Use  . Smoking status: Never Smoker  . Smokeless tobacco: Never Used  Substance and Sexual Activity  . Alcohol use: Never  . Drug use: Never  . Sexual activity: Not on file  Other Topics Concern  . Not on file    Social History Narrative  . Not on file   Social Determinants of Health   Financial Resource Strain:   . Difficulty of Paying Living Expenses: Not on file  Food Insecurity:   . Worried About Charity fundraiser in the Last Year: Not on file  . Ran Out of Food in the Last Year: Not on file  Transportation Needs:   . Lack of Transportation (Medical): Not on file  . Lack of Transportation (Non-Medical): Not on file  Physical Activity:   . Days of Exercise per Week: Not on file  . Minutes of Exercise per Session: Not on file  Stress:   . Feeling of Stress : Not on file  Social Connections:   . Frequency of Communication with Friends and Family: Not on file  . Frequency of Social Gatherings with Friends and Family: Not on file  . Attends Religious Services: Not on file  . Active Member of Clubs or Organizations: Not on file  . Attends Archivist Meetings: Not on file  . Marital Status: Not on file  Intimate Partner Violence:   . Fear of Current or Ex-Partner: Not on file  . Emotionally Abused: Not on file  . Physically Abused: Not on file  . Sexually Abused: Not on file    Family History  Problem Relation Age of Onset  . Healthy Sister      Review of Systems  Constitutional: Negative.  Negative for chills and fever.  HENT: Negative.  Negative for congestion and sore throat.   Respiratory: Negative.  Negative for cough and shortness of breath.   Cardiovascular: Negative.  Negative for chest pain and palpitations.  Gastrointestinal: Negative.  Negative for abdominal pain, blood in stool, diarrhea, melena, nausea and vomiting.       Rectal itching  Genitourinary: Negative for dysuria and hematuria.  Skin: Negative.   Neurological: Negative.  Negative for dizziness and headaches.  All other systems reviewed and are negative.  Today's Vitals   05/08/20 0856  BP: 112/72  Pulse: 69  Temp: 97.9 F (36.6 C)  TempSrc: Temporal  SpO2: 96%  Weight: 189 lb 9.6 oz  (86 kg)  Height: _0  (1.626 m)   Body mass index is 32.54 kg/m.   Physical Exam Vitals reviewed.  Constitutional:      Appearance: Normal appearance.  HENT:     Head: Normocephalic.  Eyes:     Extraocular Movements: Extraocular movements intact.     Conjunctiva/sclera: Conjunctivae normal.     Pupils: Pupils are equal, round, and reactive to light.  Cardiovascular:     Rate and Rhythm: Normal rate and regular rhythm.     Pulses: Normal pulses.     Heart sounds: Normal heart sounds.  Pulmonary:     Effort: Pulmonary effort is normal.     Breath sounds: Normal breath sounds.  Musculoskeletal:     Cervical back: Normal range of motion and neck supple.  Skin:    General: Skin is warm and dry.     Capillary Refill: Capillary refill takes less than 2 seconds.  Neurological:     General: No focal deficit present.     Mental Status: She is alert and oriented to person, place, and time.  Psychiatric:        Mood and Affect: Mood normal.        Behavior: Behavior normal.     A total of 30 minutes was spent with the patient, greater than 50% of which was in counseling/coordination of care regarding chronic medical problems and cardiovascular risks associated with these conditions, review of all medications, review of most recent blood work results, review of most recent office visit notes, education on nutrition, health maintenance items, documentation, prognosis, and need for follow-up.   ASSESSMENT & PLAN: Clinically stable.  No medical concerns identified during this visit. Continue present medications.  No changes. Follow-up in 6 months. Tracey Campbell was seen today for medical management of chronic issues.  Diagnoses and all orders for this visit:  Hypothyroidism, unspecified type -     levothyroxine (SYNTHROID) 125 MCG tablet; Take 1 tablet (125 mcg total) by mouth daily before breakfast. -     CMP14+EGFR -     TSH  Dyslipidemia -     atorvastatin (LIPITOR) 40 MG tablet;  Take 1 tablet (40 mg total) by mouth daily. -     CMP14+EGFR -     Lipid panel -     Hemoglobin A1c  Body mass index (BMI) of 32.0-32.9 in adult  External hemorrhoids -     hydrocortisone (ANUSOL-HC) 2.5 % rectal cream; Place 1 application rectally 2 (two) times daily.    Patient Instructions       If you have lab work done today you will be contacted with your lab results within the next 2 weeks.  If you have not heard from [place] then please contact us. The fastest way  to get your results is to register for My Chart.   IF you received an x-ray today, you will receive an invoice from [facility]. Please contact [facility] at [phone] with questions or concerns regarding your invoice.   IF you received labwork today, you will receive an invoice from [place]. Please contact [facility] at [phone] with questions or concerns regarding your invoice.   Our billing staff will not be able to assist you with questions regarding bills from these companies.  You will be contacted with the lab results as soon as they are available. The fastest way to get your results is to activate your My Chart account. Instructions are located on the last page of this paperwork. If you have not heard from [place] regarding the results in 2 weeks, please contact this office.      Mantenimiento de Technical sales engineer en [place] Maintenance, Female Adoptar un estilo de vida saludable y recibir atencin preventiva son importantes para promover la salud y Musician. Consulte al mdico sobre:  El esquema adecuado para hacerse pruebas y exmenes peridicos.  Cosas que puede hacer por su cuenta para prevenir enfermedades y [employer]. Qu debo saber sobre la dieta, el peso y el ejercicio? Consuma una dieta saludable   Consuma una dieta que incluya muchas verduras, frutas, productos lcteos con bajo contenido de [place] y Advertising account planner.  No consuma muchos alimentos ricos en  grasas slidas, azcares agregados o sodio. Mantenga un peso saludable El ndice de masa muscular [facility]) se [place] para identificar problemas de [place]. Proporciona una estimacin de la grasa corporal basndose en el peso y la altura. Su mdico puede ayudarle a Radiation protection practitioner Margaret y a Scientist, forensic o Theatre manager un peso saludable. Haga ejercicio con regularidad Haga ejercicio con regularidad. Esta es una de las prcticas ms importantes que puede hacer por su salud. La mayora de los adultos deben seguir estas pautas:  Optometrist, al menos, 189mnutos de actividad fsica por semana. El ejercicio debe aumentar la frecuencia cardaca y hNature conservation officertranspirar (ejercicio de intensidad moderada).  Hacer ejercicios de fortalecimiento por lo mHalliburton Companypor semana. Agregue esto a su plan de ejercicio de intensidad moderada.  Pasar menos tiempo sentados. Incluso la actividad fsica ligera puede ser beneficiosa. Controle sus niveles de colesterol y lpidos en la sangre Comience a realizarse anlisis de lpidos y cResearch officer, trade unionen la sangre a los 20aos y luego reptalos cada 5aos. Hgase controlar los niveles de colesterol con mayor frecuencia si:  Sus niveles de lpidos y colesterol son altos.  Es mayor de 40aos.  Presenta un alto riesgo de padecer enfermedades cardacas. Qu debo saber sobre las pruebas de deteccin del cncer? Segn su historia clnica y sus antecedentes familiares, es posible que deba realizarse pruebas de deteccin del cncer en diferentes edades. Esto puede incluir pruebas de deteccin de lo siguiente:  Cncer de mama.  Cncer de cuello uterino.  Cncer colorrectal.  Cncer de piel.  Cncer de pulmn. Qu debo saber sobre la enfermedad cardaca, la diabetes y la hipertensin arterial? Presin arterial y enfermedad cardaca  La hipertensin arterial causa enfermedades cardacas y aSerbiael riesgo de accidente cerebrovascular. Es ms probable que esto se manifieste en las personas  que tienen lecturas de presin arterial alta, tienen ascendencia africana o tienen sobrepeso.  Hgase controlar la presin arterial: ? Cada 3 a 5 aos si tiene entre 18 y 342aos. ? Todos los aos si es mayor de 4[place] Diabetes Realcese exmenes de deteccin de  la diabetes con regularidad. Este anlisis revisa el nivel de azcar en la sangre en [place]. Hgase las pruebas de deteccin:  Cada tresaos despus de los 98aos de edad si tiene un peso normal y un bajo riesgo de padecer diabetes.  Con ms frecuencia y a partir de [place] edad inferior si tiene sobrepeso o un alto riesgo de padecer diabetes. Qu debo saber sobre la prevencin de infecciones? Hepatitis B Si tiene un riesgo ms alto de contraer hepatitis B, debe someterse a un examen de deteccin de este virus. Hable con el mdico para averiguar si tiene riesgo de contraer la infeccin por hepatitis B. Hepatitis C Se recomienda el anlisis a:  [employer] 1945 y 1965.  Todas las personas que tengan un riesgo de haber contrado hepatitis C. Enfermedades de transmisin sexual (ETS)  Hgase las pruebas de Programme researcher, broadcasting/film/video de ITS, incluidas la gonorrea y la clamidia, si: ? Es sexualmente activa y es menor de [place]. ? Es mayor de 24aos, y Investment banker, operational informa que corre riesgo de tener este tipo de infecciones. ? La actividad sexual ha cambiado desde que le hicieron la ltima prueba de deteccin y tiene un riesgo mayor de Best boy clamidia o Radio broadcast assistant. Pregntele al mdico si usted tiene riesgo.  Pregntele al mdico si usted tiene un alto riesgo de Museum/gallery curator VIH. El mdico tambin puede recomendarle un medicamento recetado para ayudar a evitar la infeccin por el VIH. Si elige tomar medicamentos para prevenir el VIH, primero debe [employer] de deteccin del VIH. Luego debe hacerse anlisis cada 101mses mientras est tomando los medicamentos. Embarazo  Si est por dejar de mLibrarian, academic(fase premenopusica) y usted  puede quedar e[place] busque asesoramiento antes de qBotsw[place]  Tome de 400 a [id number](mcg) de cido fAnheuser-Buschsi qI[place]  Pida mtodos de control de la natalidad (anticonceptivos) si desea evitar un embarazo no deseado. Osteoporosis y mBrazilLa osteoporosis es una enfermedad en la que los huesos pierden los minerales y la fuerza por el avance de la edad. El resultado pueden ser fracturas en los hSalina Si tiene 65aos o ms, o si est en riesgo de sufrir osteoporosis y fracturas, pregunte a su mdico si debe:  Hacerse pruebas de deteccin de prdida sea.  Tomar un suplemento de calcio o de vitamina D para reducir el riesgo de fracturas.  Recibir terapia de reemplazo hormonal (TRH) para tratar los sntomas de la menopausia. Siga estas instrucciones en su casa: Estilo de vida  No consuma ningn producto que contenga nicotina o tabaco, como cigarrillos, cigarrillos electrnicos y tabaco de mHigher education careers adviser Si necesita ayuda para dejar de fumar, consulte al mdico.  No consuma drogas.  No comparta agujas.  Solicite ayuda a su mdico si necesita apoyo o informacin para abandonar las drogas. Consumo de alcohol  No beba alcohol si: ? Su mdico le indica no hacerlo. ? Est embarazada, puede estar embarazada o est tratando de quedar embarazada.  Si bebe alcohol: ? Limite la cantidad que consume de 0 a 1 medida por da. ? Limite la ingesta si est amamantando.  Est atento a la cantidad de alcohol que hay en las bebidas que toma. En los [place] una medida equivale a una botella de cerveza de 12oz (3563m, un vaso de vino de 5oz (14840mo un vaso de una bebida alcohlica de alta graduacin de 1oz (66m32mInstrucciones generales  Realcese los estudios de rutina de la salud, dentales y de la vPublic librarian  [place].  Infrmele a su mdico si: ? Se siente deprimida con frecuencia. ? Alguna vez ha sido vctima de  [place] o no se siente segura en su casa. Resumen  Adoptar un estilo de vida saludable y recibir atencin preventiva son importantes para promover la salud y Musician.  Siga las instrucciones del mdico acerca de una dieta saludable, el ejercicio y la realizacin de pruebas o exmenes para Engineer, building services.  Siga las instrucciones del mdico con respecto al control del colesterol y la presin arterial. Esta informacin no tiene Marine scientist el consejo del mdico. Asegrese de hacerle al mdico cualquier pregunta que tenga. Document Revised: 07/27/2018 Document Reviewed: 07/27/2018 Elsevier Patient Education  2020 Elsevier Inc.     Agustina Caroli, MD [facility]

## 2020-05-08 NOTE — Patient Instructions (Addendum)
   If you have lab work done today you will be contacted with your lab results within the next 2 weeks.  If you have not heard from us then please contact us. The fastest way to get your results is to register for My Chart.   IF you received an x-ray today, you will receive an invoice from [facility]. Please contact [facility] at [phone] with questions or concerns regarding your invoice.   IF you received labwork today, you will receive an invoice from [facility]. Please contact [facility] at [phone] with questions or concerns regarding your invoice.   Our billing staff will not be able to assist you with questions regarding bills from these companies.  You will be contacted with the lab results as soon as they are available. The fastest way to get your results is to activate your My Chart account. Instructions are located on the last page of this paperwork. If you have not heard from us regarding the results in 2 weeks, please contact this office.     Mantenimiento de la salud en las mujeres Health Maintenance, Female Adoptar un estilo de vida saludable y recibir atencin preventiva son importantes para promover la salud y el bienestar. Consulte al mdico sobre:  El esquema adecuado para hacerse pruebas y exmenes peridicos.  Cosas que puede hacer por su cuenta para prevenir enfermedades y mantenerse sana. Qu debo saber sobre la dieta, el peso y el ejercicio? Consuma una dieta saludable   Consuma una dieta que incluya muchas verduras, frutas, productos lcteos con bajo contenido de grasa y protenas magras.  No consuma muchos alimentos ricos en grasas slidas, azcares agregados o sodio. Mantenga un peso saludable El ndice de masa muscular (IMC) se utiliza para identificar problemas de peso. Proporciona una estimacin de la grasa corporal basndose en el peso y la altura. Su mdico puede ayudarle a determinar su IMC y a lograr o mantener un peso  saludable. Haga ejercicio con regularidad Haga ejercicio con regularidad. Esta es una de las prcticas ms importantes que puede hacer por su salud. La mayora de los adultos deben seguir estas pautas:  Realizar, al menos, 150minutos de actividad fsica por semana. El ejercicio debe aumentar la frecuencia cardaca y hacerlo transpirar (ejercicio de intensidad moderada).  Hacer ejercicios de fortalecimiento por lo menos dos veces por semana. Agregue esto a su plan de ejercicio de intensidad moderada.  Pasar menos tiempo sentados. Incluso la actividad fsica ligera puede ser beneficiosa. Controle sus niveles de colesterol y lpidos en la sangre Comience a realizarse anlisis de lpidos y colesterol en la sangre a los 20aos y luego reptalos cada 5aos. Hgase controlar los niveles de colesterol con mayor frecuencia si:  Sus niveles de lpidos y colesterol son altos.  Es mayor de 40aos.  Presenta un alto riesgo de padecer enfermedades cardacas. Qu debo saber sobre las pruebas de deteccin del cncer? Segn su historia clnica y sus antecedentes familiares, es posible que deba realizarse pruebas de deteccin del cncer en diferentes edades. Esto puede incluir pruebas de deteccin de lo siguiente:  Cncer de mama.  Cncer de cuello uterino.  Cncer colorrectal.  Cncer de piel.  Cncer de pulmn. Qu debo saber sobre la enfermedad cardaca, la diabetes y la hipertensin arterial? Presin arterial y enfermedad cardaca  La hipertensin arterial causa enfermedades cardacas y aumenta el riesgo de accidente cerebrovascular. Es ms probable que esto se manifieste en las personas que tienen lecturas de presin arterial alta, tienen ascendencia africana o   tienen sobrepeso.  Hgase controlar la presin arterial: ? Cada 3 a 5 aos si tiene entre 18 y 39 aos. ? Todos los aos si es mayor de 40aos. Diabetes Realcese exmenes de deteccin de la diabetes con regularidad. Este  anlisis revisa el nivel de azcar en la sangre en ayunas. Hgase las pruebas de deteccin:  Cada tresaos despus de los 40aos de edad si tiene un peso normal y un bajo riesgo de padecer diabetes.  Con ms frecuencia y a partir de una edad inferior si tiene sobrepeso o un alto riesgo de padecer diabetes. Qu debo saber sobre la prevencin de infecciones? Hepatitis B Si tiene un riesgo ms alto de contraer hepatitis B, debe someterse a un examen de deteccin de este virus. Hable con el mdico para averiguar si tiene riesgo de contraer la infeccin por hepatitis B. Hepatitis C Se recomienda el anlisis a:  Todos los que nacieron entre 1945 y 1965.  Todas las personas que tengan un riesgo de haber contrado hepatitis C. Enfermedades de transmisin sexual (ETS)  Hgase las pruebas de deteccin de ITS, incluidas la gonorrea y la clamidia, si: ? Es sexualmente activa y es menor de 24aos. ? Es mayor de 24aos, y el mdico le informa que corre riesgo de tener este tipo de infecciones. ? La actividad sexual ha cambiado desde que le hicieron la ltima prueba de deteccin y tiene un riesgo mayor de tener clamidia o gonorrea. Pregntele al mdico si usted tiene riesgo.  Pregntele al mdico si usted tiene un alto riesgo de contraer VIH. El mdico tambin puede recomendarle un medicamento recetado para ayudar a evitar la infeccin por el VIH. Si elige tomar medicamentos para prevenir el VIH, primero debe hacerse los anlisis de deteccin del VIH. Luego debe hacerse anlisis cada 3meses mientras est tomando los medicamentos. Embarazo  Si est por dejar de menstruar (fase premenopusica) y usted puede quedar embarazada, busque asesoramiento antes de quedar embarazada.  Tome de 400 a 800microgramos (mcg) de cido flico todos los das si queda embarazada.  Pida mtodos de control de la natalidad (anticonceptivos) si desea evitar un embarazo no deseado. Osteoporosis y menopausia La  osteoporosis es una enfermedad en la que los huesos pierden los minerales y la fuerza por el avance de la edad. El resultado pueden ser fracturas en los huesos. Si tiene 65aos o ms, o si est en riesgo de sufrir osteoporosis y fracturas, pregunte a su mdico si debe:  Hacerse pruebas de deteccin de prdida sea.  Tomar un suplemento de calcio o de vitamina D para reducir el riesgo de fracturas.  Recibir terapia de reemplazo hormonal (TRH) para tratar los sntomas de la menopausia. Siga estas instrucciones en su casa: Estilo de vida  No consuma ningn producto que contenga nicotina o tabaco, como cigarrillos, cigarrillos electrnicos y tabaco de mascar. Si necesita ayuda para dejar de fumar, consulte al mdico.  No consuma drogas.  No comparta agujas.  Solicite ayuda a su mdico si necesita apoyo o informacin para abandonar las drogas. Consumo de alcohol  No beba alcohol si: ? Su mdico le indica no hacerlo. ? Est embarazada, puede estar embarazada o est tratando de quedar embarazada.  Si bebe alcohol: ? Limite la cantidad que consume de 0 a 1 medida por da. ? Limite la ingesta si est amamantando.  Est atento a la cantidad de alcohol que hay en las bebidas que toma. En los [place], una medida equivale a una botella de cerveza de 12oz (355ml),   un vaso de vino de 5oz (148ml) o un vaso de una bebida alcohlica de alta graduacin de 1oz (44ml). Instrucciones generales  Realcese los estudios de rutina de la salud, dentales y de la vista.  Mantngase al da con las vacunas.  Infrmele a su mdico si: ? Se siente deprimida con frecuencia. ? Alguna vez ha sido vctima de maltrato o no se siente segura en su casa. Resumen  Adoptar un estilo de vida saludable y recibir atencin preventiva son importantes para promover la salud y el bienestar.  Siga las instrucciones del mdico acerca de una dieta saludable, el ejercicio y la realizacin de pruebas o exmenes  para detectar enfermedades.  Siga las instrucciones del mdico con respecto al control del colesterol y la presin arterial. Esta informacin no tiene como fin reemplazar el consejo del mdico. Asegrese de hacerle al mdico cualquier pregunta que tenga. Document Revised: 07/27/2018 Document Reviewed: 07/27/2018 Elsevier Patient Education  2020 Elsevier Inc.  

## 2020-05-09 LAB — CMP14+EGFR
ALT: 92 IU/L — ABNORMAL HIGH (ref 0–32)
AST: 83 IU/L — ABNORMAL HIGH (ref 0–40)
Albumin/Globulin Ratio: 1.7 (ref 1.2–2.2)
Albumin: 4.7 g/dL (ref 3.8–4.9)
Alkaline Phosphatase: 73 IU/L (ref 44–121)
BUN/Creatinine Ratio: 14 (ref 9–23)
BUN: 10 mg/dL (ref 6–24)
Bilirubin Total: 0.6 mg/dL (ref 0.0–1.2)
CO2: 23 mmol/L (ref 20–29)
Calcium: 9.4 mg/dL (ref 8.7–10.2)
Chloride: 104 mmol/L (ref 96–106)
Creatinine, Ser: 0.71 mg/dL (ref 0.57–1.00)
GFR calc Af Amer: 111 mL/min/{1.73_m2} (ref >59)
GFR calc non Af Amer: 96 mL/min/{1.73_m2} (ref >59)
Globulin, Total: 2.7 g/dL (ref 1.5–4.5)
Glucose: 99 mg/dL (ref 65–99)
Potassium: 4.2 mmol/L (ref 3.5–5.2)
Sodium: 140 mmol/L (ref 134–144)
Total Protein: 7.4 g/dL (ref 6.0–8.5)

## 2020-05-09 LAB — LIPID PANEL
Chol/HDL Ratio: 4.9 ratio — ABNORMAL HIGH (ref 0.0–4.4)
Cholesterol, Total: 244 mg/dL — ABNORMAL HIGH (ref 100–199)
HDL: 50 mg/dL (ref >39)
LDL Chol Calc (NIH): 169 mg/dL — ABNORMAL HIGH (ref 0–99)
Triglycerides: 136 mg/dL (ref 0–149)
VLDL Cholesterol Cal: 25 mg/dL (ref 5–40)

## 2020-05-09 LAB — HEMOGLOBIN A1C
Est. average glucose Bld gHb Est-mCnc: 123 mg/dL
Hgb A1c MFr Bld: 5.9 % — ABNORMAL HIGH (ref 4.8–5.6)

## 2020-05-09 LAB — TSH: TSH: 8.95 u[IU]/mL — ABNORMAL HIGH (ref 0.450–4.500)

## 2020-07-31 ENCOUNTER — Other Ambulatory Visit: Payer: Self-pay | Admitting: Emergency Medicine

## 2020-07-31 DIAGNOSIS — E785 Hyperlipidemia, unspecified: Secondary | ICD-10-CM

## 2020-07-31 DIAGNOSIS — E039 Hypothyroidism, unspecified: Secondary | ICD-10-CM

## 2020-10-14 ENCOUNTER — Other Ambulatory Visit: Payer: Self-pay | Admitting: *Deleted

## 2020-10-14 ENCOUNTER — Other Ambulatory Visit: Payer: Self-pay | Admitting: Emergency Medicine

## 2020-10-14 DIAGNOSIS — E039 Hypothyroidism, unspecified: Secondary | ICD-10-CM

## 2020-10-14 DIAGNOSIS — E785 Hyperlipidemia, unspecified: Secondary | ICD-10-CM

## 2020-10-14 NOTE — Telephone Encounter (Signed)
Routing to CMA 

## 2020-11-06 ENCOUNTER — Other Ambulatory Visit: Payer: Self-pay | Admitting: Emergency Medicine

## 2020-11-06 ENCOUNTER — Other Ambulatory Visit: Payer: Self-pay

## 2020-11-06 ENCOUNTER — Telehealth: Payer: Self-pay | Admitting: Emergency Medicine

## 2020-11-06 ENCOUNTER — Ambulatory Visit (INDEPENDENT_AMBULATORY_CARE_PROVIDER_SITE_OTHER): Payer: 59 | Admitting: Emergency Medicine

## 2020-11-06 ENCOUNTER — Encounter: Payer: Self-pay | Admitting: Emergency Medicine

## 2020-11-06 VITALS — BP 118/70 | HR 69 | Temp 98.7°F | Ht 64.0 in | Wt 191.0 lb

## 2020-11-06 DIAGNOSIS — E785 Hyperlipidemia, unspecified: Secondary | ICD-10-CM

## 2020-11-06 DIAGNOSIS — Z23 Encounter for immunization: Secondary | ICD-10-CM

## 2020-11-06 DIAGNOSIS — M549 Dorsalgia, unspecified: Secondary | ICD-10-CM | POA: Diagnosis not present

## 2020-11-06 DIAGNOSIS — E039 Hypothyroidism, unspecified: Secondary | ICD-10-CM

## 2020-11-06 LAB — LIPID PANEL
Cholesterol: 199 mg/dL (ref 0–200)
HDL: 50.2 mg/dL (ref >39.00)
LDL Cholesterol: 116 mg/dL — ABNORMAL HIGH (ref 0–99)
NonHDL: 148.3
Total CHOL/HDL Ratio: 4
Triglycerides: 160 mg/dL — ABNORMAL HIGH (ref 0.0–149.0)
VLDL: 32 mg/dL (ref 0.0–40.0)

## 2020-11-06 LAB — COMPREHENSIVE METABOLIC PANEL
ALT: 42 U/L — ABNORMAL HIGH (ref 0–35)
AST: 36 U/L (ref 0–37)
Albumin: 4.2 g/dL (ref 3.5–5.2)
Alkaline Phosphatase: 66 U/L (ref 39–117)
BUN: 12 mg/dL (ref 6–23)
CO2: 26 mEq/L (ref 19–32)
Calcium: 9.5 mg/dL (ref 8.4–10.5)
Chloride: 104 mEq/L (ref 96–112)
Creatinine, Ser: 0.7 mg/dL (ref 0.40–1.20)
GFR: 96.92 mL/min (ref >60.00)
Glucose, Bld: 97 mg/dL (ref 70–99)
Potassium: 4.3 mEq/L (ref 3.5–5.1)
Sodium: 139 mEq/L (ref 135–145)
Total Bilirubin: 0.5 mg/dL (ref 0.2–1.2)
Total Protein: 7.6 g/dL (ref 6.0–8.3)

## 2020-11-06 LAB — TSH: TSH: 11.48 u[IU]/mL — ABNORMAL HIGH (ref 0.35–4.50)

## 2020-11-06 MED ORDER — LEVOTHYROXINE SODIUM 150 MCG PO TABS: 150.0000 ug | ORAL_TABLET | Freq: Every day | ORAL | 3 refills | Status: DC

## 2020-11-06 NOTE — Progress Notes (Signed)
Lab Results  Component Value Date   TSH 8.950 (H) 05/08/2020   Tracey Campbell 55 y.o.   Chief Complaint  Patient presents with  . Follow-up    hypothyroidism    HISTORY OF PRESENT ILLNESS: This is a 55 y.o. female with history of hypothyroidism presently on Synthroid 125 mcg daily here for follow-up. Having hard time losing weight. Has history of dyslipidemia on atorvastatin 40 mg daily. Compliant with medications. Has occasional pains to upper back and joints.  Could be related to her work. No other complaints or medical concerns today.  HPI   Prior to Admission medications   Medication Sig Start Date End Date Taking? Authorizing Provider  atorvastatin (LIPITOR) 40 MG tablet TAKE 1 TABLET DAILY 10/14/20   Georgina Quint, MD  hydrocortisone (ANUSOL-HC) 2.5 % rectal cream Place 1 application rectally 2 (two) times daily. 05/08/20   Georgina Quint, MD  levothyroxine (SYNTHROID) 125 MCG tablet TAKE 1 TABLET DAILY BEFORE BREAKFAST 10/14/20   Georgina Quint, MD  Omega-3 Fatty Acids (FISH OIL) 1000 MG CPDR Take by mouth daily.    [provider]    No Known Allergies  Patient Active Problem List   Diagnosis Date Noted  . History of high cholesterol 09/28/2018  . Thyroid disorder 09/28/2018    Past Medical History:  Diagnosis Date  . Thyroid disease     Past Surgical History:  Procedure Laterality Date  . CARDIAC VALVE REPLACEMENT      Social History   Socioeconomic History  . Marital status: Married    Spouse name: Not on file  . Number of children: Not on file  . Years of education: Not on file  . Highest education level: Not on file  Occupational History  . Not on file  Tobacco Use  . Smoking status: Never Smoker  . Smokeless tobacco: Never Used  Substance and Sexual Activity  . Alcohol use: Never  . Drug use: Never  . Sexual activity: Not on file  Other Topics Concern  . Not on file  Social History Narrative  . Not on  file   Social Determinants of Health   Financial Resource Strain: Not on file  Food Insecurity: Not on file  Transportation Needs: Not on file  Physical Activity: Not on file  Stress: Not on file  Social Connections: Not on file  Intimate Partner Violence: Not on file    Family History  Problem Relation Age of Onset  . Healthy Sister      Review of Systems  Constitutional: Negative.  Negative for chills and fever.  HENT: Negative.  Negative for congestion and sore throat.   Respiratory: Negative.  Negative for cough and shortness of breath.   Cardiovascular: Negative.  Negative for chest pain and palpitations.  Gastrointestinal: Negative.  Negative for abdominal pain, diarrhea, nausea and vomiting.  Genitourinary: Negative.  Negative for dysuria and hematuria.  Musculoskeletal: Positive for back pain and joint pain.  Skin: Negative.   Neurological: Negative.  Negative for dizziness and headaches.  All other systems reviewed and are negative.  Today's Vitals   11/06/20 0806  BP: 118/70  Pulse: 69  Temp: 98.7 F (37.1 C)  TempSrc: Oral  SpO2: 97%  Weight: 191 lb (86.6 kg)  Height: 5\' 4"  (1.626 m)   Body mass index is 32.79 kg/m. Wt Readings from Last 3 Encounters:  11/06/20 191 lb (86.6 kg)  05/08/20 189 lb 9.6 oz (86 kg)  04/03/20 190 lb (86.2 kg)  Physical Exam Vitals reviewed.  Constitutional:      Appearance: Normal appearance.  HENT:     Head: Normocephalic.     Mouth/Throat:     Mouth: Mucous membranes are moist.     Pharynx: Oropharynx is clear.  Eyes:     Extraocular Movements: Extraocular movements intact.     Conjunctiva/sclera: Conjunctivae normal.     Pupils: Pupils are equal, round, and reactive to light.  Cardiovascular:     Rate and Rhythm: Normal rate and regular rhythm.     Pulses: Normal pulses.     Heart sounds: Normal heart sounds.  Pulmonary:     Effort: Pulmonary effort is normal.     Breath sounds: Normal breath sounds.   Musculoskeletal:        General: Normal range of motion.  Skin:    General: Skin is warm and dry.  Neurological:     General: No focal deficit present.     Mental Status: She is alert and oriented to person, place, and time.  Psychiatric:        Mood and Affect: Mood normal.        Behavior: Behavior normal.     A total of 30 minutes was spent with the patient, greater than 50% of which was in counseling/coordination of care regarding hypothyroidism and dyslipidemia and cardiovascular risks associated with these conditions, review of all medications, review of most recent blood work results, review of most recent office visit notes, education on nutrition, health maintenance items, documentation, prognosis and need for follow-up.   ASSESSMENT & PLAN: Hypothyroidism Last TSH was elevated.  Presently taking 125 mcg of Synthroid.  Having issues with weight gain.  May still be clinically hypothyroid.  I will check TSH today and adjust medication as needed.  [name] was seen today for follow-up.  Diagnoses and all orders for this visit:  Hypothyroidism, unspecified type -     Comprehensive metabolic panel -     TSH  Dyslipidemia -     Comprehensive metabolic panel -     Lipid panel  Need for diphtheria-tetanus-pertussis (Tdap) vaccine -     Tdap vaccine greater than or equal to 7yo IM  Musculoskeletal back pain    Patient Instructions  Hipotiroidismo Hypothyroidism  El hipotiroidismo ocurre cuando la glndula tiroidea no produce la cantidad suficiente de ciertas hormonas (es hipoactiva). La glndula tiroidea es una pequea glndula ubicada en la parte delantera inferior del cuello, justo delante de la trquea. Esta glndula produce hormonas que ayudan a Scientist, physiological forma en la que el cuerpo [place] los alimentos para obtener energa (metabolismo) as como tambin la funcin cardaca y la funcin cerebral. Estas hormonas tambin juegan un papel para [employer] huesos fuertes.  Cuando la tiroides es hipoactiva, produce muy poca cantidad de las hormonas tiroxina (T4) y triyodotironina (T3). Cules son las causas? Esta afeccin puede ser causada por lo siguiente:  Enfermedad de Hashimoto. Se trata de una enfermedad por la cual el sistema del cuerpo encargado de combatir las enfermedades (sistema inmunitario) ataca la glndula tiroidea. Esta es la causa ms frecuente.  Infecciones virales.  Embarazo.  Ciertos medicamentos.  Defectos congnitos.  Radioterapias anteriores en la cabeza o el cuello para Management consultant.  Tratamiento previo con yodo radioactivo.  Exposicin a la radiacin en el ambiente, en el pasado.  Extirpacin quirrgica previa de una parte o de toda la tiroides.  Problemas con Neomia Dear glndula ubicada en el centro del cerebro (hipfisis).  Falta de  una cantidad suficiente de yodo en la dieta. Qu incrementa el riesgo? Es ms probable que sufra esta afeccin si:  Es mujer.  Tiene antecedentes familiares de afecciones tiroideas.  [place] un medicamento denominado litio.  Toma medicamentos que afectan el sistema inmunitario (inmunosupresores). Cules son los signos o sntomas? Los sntomas de esta afeccin incluyen:  Sensacin de falta de energa (letargo).  Incapacidad para tolerar el fro.  Aumento de peso que no puede explicarse por un cambio en la dieta o en los hbitos de ejercicio fsico.  Falta de apetito.  Piel seca.  Pelo grueso.  Irregularidades menstruales.  Ralentizacin de los procesos de pensamiento.  Estreimiento.  Tristeza o depresin. Cmo se diagnostica? Esta afeccin se puede diagnosticar en funcin de lo siguiente:  Los sntomas, los antecedentes mdicos y un examen fsico.  Anlisis de Retail buyer. Tambin puede someterse a estudios por imgenes como una ecografa o una resonancia magntica (RM). Cmo se trata? Esta afeccin se trata con medicamentos que reemplazan las hormonas tiroideas que el cuerpo no  produce. Despus de Microbiologist, pueden pasar varias semanas hasta la desaparicin de los sntomas. Siga estas instrucciones en su casa:  Use los medicamentos de venta libre y los recetados solamente como se lo haya indicado el mdico.  Si empieza a tomar medicamentos nuevos, infrmele al mdico.  Oceanographer a todas las visitas de seguimiento como se lo haya indicado el mdico. Esto es importante. ? A medida que la afeccin mejora, es posible que haya que modificar las dosis de los medicamentos de hormona tiroidea. ? Tendr que hacerse anlisis de sangre peridicamente, de modo que el mdico pueda Passenger transport manager. Comunquese con un mdico si:  Los sntomas no mejoran con Scientist, research (medical).  Est tomando medicamentos de reemplazo de la hormona tiroidea y: ? Wendall Stade. ? Tiene temblores. ? Se siente ansioso. ? [place] pes[place] rpidamente. ? No puede Patent examiner. ? Tiene cambios emocionales. ? Tiene diarrea. ? Se siente dbil. Solicite ayuda de inmediato si tiene:  Dolor de Hotel manager.  Latidos cardacos irregulares.  Latidos cardacos rpidos.  Dificultad para respirar. Resumen  El hipotiroidismo ocurre cuando la glndula tiroidea no produce la cantidad suficiente de ciertas hormonas (es hipoactiva).  Cuando la tiroides es hipoactiva, produce muy poca cantidad de las hormonas tiroxina (T4) y triyodotironina (T3).  La causa ms frecuente es la enfermedad de Hashimoto, una enfermedad por la cual el sistema del cuerpo encargado de combatir las enfermedades (sistema inmunitario) ataca la glndula tiroidea. La afeccin tambin puede ser consecuencia de infecciones virales, medicamentos, el embarazo o luego de un tratamiento de radioterapia en la cabeza o el cuello.  Los sntomas pueden incluir aumento de [place], piel seca, estreimiento, sensacin de no tener energa e incapacidad para [employer] fro.  Esta afeccin se trata con medicamentos que reemplazan las hormonas  tiroideas que el cuerpo no produce. Esta informacin no tiene Theme park manager el consejo del mdico. Asegrese de hacerle al mdico cualquier pregunta que tenga. Document Revised: 05/09/2020 Document Reviewed: 04/12/2020 Elsevier Patient Education  2021 Elsevier Inc.      Edwina Barth, MD [facility]

## 2020-11-06 NOTE — Assessment & Plan Note (Signed)
Last TSH was elevated.  Presently taking 125 mcg of Synthroid.  Having issues with weight gain.  May still be clinically hypothyroid.  I will check TSH today and adjust medication as needed.

## 2020-11-06 NOTE — Patient Instructions (Signed)
Hipotiroidismo Hypothyroidism  El hipotiroidismo ocurre cuando la glndula tiroidea no produce la cantidad suficiente de ciertas hormonas (es hipoactiva). La glndula tiroidea es una pequea glndula ubicada en la parte delantera inferior del cuello, justo delante de la trquea. Esta glndula produce hormonas que ayudan a controlar la forma en la que el cuerpo usa los alimentos para obtener energa (metabolismo) as como tambin la funcin cardaca y la funcin cerebral. Estas hormonas tambin juegan un papel para mantener los huesos fuertes. Cuando la tiroides es hipoactiva, produce muy poca cantidad de las hormonas tiroxina (T4) y triyodotironina (T3). Cules son las causas? Esta afeccin puede ser causada por lo siguiente:  Enfermedad de Hashimoto. Se trata de una enfermedad por la cual el sistema del cuerpo encargado de combatir las enfermedades (sistema inmunitario) ataca la glndula tiroidea. Esta es la causa ms frecuente.  Infecciones virales.  Embarazo.  Ciertos medicamentos.  Defectos congnitos.  Radioterapias anteriores en la cabeza o el cuello para el cncer.  Tratamiento previo con yodo radioactivo.  Exposicin a la radiacin en el ambiente, en el pasado.  Extirpacin quirrgica previa de una parte o de toda la tiroides.  Problemas con una glndula ubicada en el centro del cerebro (hipfisis).  Falta de una cantidad suficiente de yodo en la dieta. Qu incrementa el riesgo? Es ms probable que sufra esta afeccin si:  Es mujer.  Tiene antecedentes familiares de afecciones tiroideas.  [place] un medicamento denominado litio.  Toma medicamentos que afectan el sistema inmunitario (inmunosupresores). Cules son los signos o sntomas? Los sntomas de esta afeccin incluyen:  Sensacin de falta de energa (letargo).  Incapacidad para tolerar el fro.  Aumento de peso que no puede explicarse por un cambio en la dieta o en los hbitos de ejercicio fsico.  Falta de  apetito.  Piel seca.  Pelo grueso.  Irregularidades menstruales.  Ralentizacin de los procesos de pensamiento.  Estreimiento.  Tristeza o depresin. Cmo se diagnostica? Esta afeccin se puede diagnosticar en funcin de lo siguiente:  Los sntomas, los antecedentes mdicos y un examen fsico.  Anlisis de sangre. Tambin puede someterse a estudios por imgenes como una ecografa o una resonancia magntica (RM). Cmo se trata? Esta afeccin se trata con medicamentos que reemplazan las hormonas tiroideas que el cuerpo no produce. Despus de comenzar el tratamiento, pueden pasar varias semanas hasta la desaparicin de los sntomas. Siga estas instrucciones en su casa:  Use los medicamentos de venta libre y los recetados solamente como se lo haya indicado el mdico.  Si empieza a tomar medicamentos nuevos, infrmele al mdico.  Concurra a todas las visitas de seguimiento como se lo haya indicado el mdico. Esto es importante. ? A medida que la afeccin mejora, es posible que haya que modificar las dosis de los medicamentos de hormona tiroidea. ? Tendr que hacerse anlisis de sangre peridicamente, de modo que el mdico pueda controlar la afeccin. Comunquese con un mdico si:  Los sntomas no mejoran con el tratamiento.  Est tomando medicamentos de reemplazo de la hormona tiroidea y: ? Suda mucho. ? Tiene temblores. ? Se siente ansioso. ? [place] de peso rpidamente. ? No puede tolerar el calor. ? Tiene cambios emocionales. ? Tiene diarrea. ? Se siente dbil. Solicite ayuda de inmediato si tiene:  Dolor de pecho.  Latidos cardacos irregulares.  Latidos cardacos rpidos.  Dificultad para respirar. Resumen  El hipotiroidismo ocurre cuando la glndula tiroidea no produce la cantidad suficiente de ciertas hormonas (es hipoactiva).  Cuando la tiroides es hipoactiva, produce   muy poca cantidad de las hormonas tiroxina (T4) y triyodotironina (T3).  La causa ms  frecuente es la enfermedad de Hashimoto, una enfermedad por la cual el sistema del cuerpo encargado de combatir las enfermedades (sistema inmunitario) ataca la glndula tiroidea. La afeccin tambin puede ser consecuencia de infecciones virales, medicamentos, el embarazo o luego de un tratamiento de radioterapia en la cabeza o el cuello.  Los sntomas pueden incluir aumento de peso, piel seca, estreimiento, sensacin de no tener energa e incapacidad para tolerar el fro.  Esta afeccin se trata con medicamentos que reemplazan las hormonas tiroideas que el cuerpo no produce. Esta informacin no tiene como fin reemplazar el consejo del mdico. Asegrese de hacerle al mdico cualquier pregunta que tenga. Document Revised: 05/09/2020 Document Reviewed: 04/12/2020 Elsevier Patient Education  2021 Elsevier Inc.  

## 2020-11-06 NOTE — Telephone Encounter (Signed)
Blood results discussed with patient. Elevated TSH.  Will increase dose of Synthroid.  Follow-up in 3 months.

## 2020-12-26 ENCOUNTER — Ambulatory Visit (INDEPENDENT_AMBULATORY_CARE_PROVIDER_SITE_OTHER): Payer: 59 | Admitting: Nurse Practitioner

## 2020-12-26 ENCOUNTER — Encounter: Payer: Self-pay | Admitting: Nurse Practitioner

## 2020-12-26 ENCOUNTER — Other Ambulatory Visit: Payer: Self-pay

## 2020-12-26 VITALS — BP 120/78 | Ht 64.0 in | Wt 189.0 lb

## 2020-12-26 DIAGNOSIS — N393 Stress incontinence (female) (male): Secondary | ICD-10-CM | POA: Diagnosis not present

## 2020-12-26 DIAGNOSIS — Z01419 Encounter for gynecological examination (general) (routine) without abnormal findings: Secondary | ICD-10-CM

## 2020-12-26 DIAGNOSIS — Z780 Asymptomatic menopausal state: Secondary | ICD-10-CM | POA: Diagnosis not present

## 2020-12-26 NOTE — Progress Notes (Signed)
   Tracey Campbell Oct 31, 1965 [id number]   History:  55 y.o. G3P0003 presents for annual exam. Postmenopausal - no HRT, no bleeding. Normal pap and mammogram history. Hypothyroidism managed by PCP. Complains of stress incontinence that is not new for her. It is mostly with lifting at work.   Gynecologic History No LMP recorded. Patient is postmenopausal.   Contraception/Family planning: post menopausal status  Health Maintenance Last Pap: 12/26/2019. Results were: normal Last mammogram: 12/25/2019. Results were: normal Last colonoscopy: Never. Negative Cologuard 2021 Last Dexa: Not indicated  Past medical history, past surgical history, family history and social history were all reviewed and documented in the EPIC chart. Married. 3 children in their 30s, 2 grandchildren ages 2 and 73, one on the way. Works in Development worker, community at [employer].   ROS:  A ROS was performed and pertinent positives and negatives are included.  Exam:  Vitals:   12/26/20 1437  BP: 120/78  Weight: 189 lb (85.7 kg)  Height: 5\' 4"  (1.626 m)   Body mass index is 32.44 kg/m.  General appearance:  Normal Thyroid:  Symmetrical, normal in size, without palpable masses or nodularity. Respiratory  Auscultation:  Clear without wheezing or rhonchi Cardiovascular  Auscultation:  Regular rate, without rubs, murmurs or gallops  Edema/varicosities:  Not grossly evident Abdominal  Soft,nontender, without masses, guarding or rebound.  Liver/spleen:  No organomegaly noted  Hernia:  None appreciated  Skin  Inspection:  Grossly normal Breasts: Examined lying and sitting.   Right: Without masses, retractions, nipple discharge or axillary adenopathy.   Left: Without masses, retractions, nipple discharge or axillary adenopathy. Genitourinary   Inguinal/mons:  Normal without inguinal adenopathy  External genitalia:  Normal appearing vulva with no masses, tenderness, or lesions  BUS/Urethra/Skene's glands:  Normal  Vagina:   Normal appearing with normal color and discharge, no lesions  Cervix:  Normal appearing without discharge or lesions  Uterus:  Normal in size, shape and contour.  Midline and mobile, nontender  Adnexa/parametria:     Rt: Normal in size, without masses or tenderness.   Lt: Normal in size, without masses or tenderness.  Anus and perineum: Normal  Digital rectal exam: Normal sphincter tone without palpated masses or tenderness  Assessment/Plan:  55 y.o. G3P0003 for annual exam.   Well female exam with routine gynecological exam - Education provided on SBEs, importance of preventative screenings, current guidelines, high calcium diet, regular exercise, and multivitamin daily. Labs with PCP.   Postmenopausal - no HRT, no bleeding.   Stress incontinence - this is not new for her and has not worsened. We talked about lifestyle changes and weight loss to help with symptoms. If symptoms persist we discussed option for pelvic floor PT.   Screening for cervical cancer - Normal Pap history.  Will repeat at 5-year interval per guidelines.  Screening for breast cancer - Normal mammogram history.  Continue annual screenings.  Normal breast exam today.  Screening for colon cancer - Negative cologuard in 2021.   Return in 1 year for annual.    2022 DNP, 2:43 PM 12/26/2020

## 2021-04-16 ENCOUNTER — Encounter: Payer: Self-pay | Admitting: Emergency Medicine

## 2021-05-08 ENCOUNTER — Ambulatory Visit: Payer: 59 | Admitting: Emergency Medicine

## 2021-05-15 ENCOUNTER — Other Ambulatory Visit: Payer: Self-pay

## 2021-05-15 ENCOUNTER — Encounter: Payer: Self-pay | Admitting: Emergency Medicine

## 2021-05-15 ENCOUNTER — Ambulatory Visit (INDEPENDENT_AMBULATORY_CARE_PROVIDER_SITE_OTHER): Payer: Self-pay | Admitting: Emergency Medicine

## 2021-05-15 VITALS — BP 124/86 | HR 70 | Ht 64.0 in | Wt 191.0 lb

## 2021-05-15 DIAGNOSIS — E785 Hyperlipidemia, unspecified: Secondary | ICD-10-CM

## 2021-05-15 DIAGNOSIS — E039 Hypothyroidism, unspecified: Secondary | ICD-10-CM

## 2021-05-15 LAB — TSH: TSH: 1.63 u[IU]/mL (ref 0.35–5.50)

## 2021-05-15 NOTE — Assessment & Plan Note (Signed)
Diet and nutrition discussed.  Continue atorvastatin 40 mg daily. The 10-year ASCVD risk score (Arnett DK, et al., 2019) is: 2.2%   Values used to calculate the score:     Age: 55 years     Sex: Female     Is Non-Hispanic African American: No     Diabetic: No     Tobacco smoker: No     Systolic Blood Pressure: 124 mmHg     Is BP treated: No     HDL Cholesterol: 50.2 mg/dL     Total Cholesterol: 199 mg/dL

## 2021-05-15 NOTE — Assessment & Plan Note (Signed)
Clinically stable.  On Synthroid 150 mcg daily. Will do TSH level today.

## 2021-05-15 NOTE — Patient Instructions (Signed)
Hipotiroidismo Hypothyroidism  El hipotiroidismo ocurre cuando la glndula tiroidea no produce la cantidad suficiente de ciertas hormonas (es hipoactiva). La glndula tiroidea es una pequea glndula ubicada en la parte delantera inferior del cuello, justo delante de la trquea. Esta glndula produce hormonas que ayudan a controlar la forma en la que el cuerpo usa los alimentos para obtener energa (metabolismo) as como tambin la funcin cardaca y la funcin cerebral. Estas hormonas tambin juegan un papel para mantener los huesos fuertes. Cuando la tiroides es hipoactiva, produce muy poca cantidad de las hormonas tiroxina (T4) y triyodotironina (T3). Cules son las causas? Esta afeccin puede ser causada por lo siguiente: Enfermedad de Hashimoto. Se trata de una enfermedad por la cual el sistema del cuerpo encargado de combatir las enfermedades (sistema inmunitario) ataca la glndula tiroidea. Esta es la causa ms frecuente. Infecciones virales. Embarazo. Ciertos medicamentos. Defectos congnitos. Radioterapias anteriores en la cabeza o el cuello para el cncer. Tratamiento previo con yodo radioactivo. Exposicin a la radiacin en el ambiente, en el pasado. Extirpacin quirrgica previa de una parte o de toda la tiroides. Problemas con una glndula ubicada en el centro del cerebro (hipfisis). Falta de una cantidad suficiente de yodo en la dieta. Qu incrementa el riesgo? Es ms probable que sufra esta afeccin si: Es mujer. Tiene antecedentes familiares de afecciones tiroideas. [place] un medicamento denominado litio. Toma medicamentos que afectan el sistema inmunitario (inmunosupresores). Cules son los signos o sntomas? Los sntomas de esta afeccin incluyen: Sensacin de falta de energa (letargo). Incapacidad para tolerar el fro. Aumento de peso que no puede explicarse por un cambio en la dieta o en los hbitos de ejercicio fsico. Falta de apetito. Piel seca. Pelo  grueso. Irregularidades menstruales. Ralentizacin de los procesos de pensamiento. Estreimiento. Tristeza o depresin. Cmo se diagnostica? Esta afeccin se puede diagnosticar en funcin de lo siguiente: Los sntomas, los antecedentes mdicos y un examen fsico. Anlisis de sangre. Tambin puede someterse a estudios por imgenes como una ecografa o unaresonancia magntica (RM). Cmo se trata? Esta afeccin se trata con medicamentos que reemplazan las hormonas tiroideas que el cuerpo no produce. Despus de comenzar el tratamiento, pueden pasarvarias semanas hasta la desaparicin de los sntomas. Siga estas instrucciones en su casa: Use los medicamentos de venta libre y los recetados solamente como se lo haya indicado el mdico. Si empieza a tomar medicamentos nuevos, infrmele al mdico. Concurra a todas las visitas de seguimiento como se lo haya indicado el mdico. Esto es importante. A medida que la afeccin mejora, es posible que haya que modificar las dosis de los medicamentos de hormona tiroidea. Tendr que hacerse anlisis de sangre peridicamente, de modo que el mdico pueda controlar la afeccin. Comunquese con un mdico si: Los sntomas no mejoran con el tratamiento. Est tomando medicamentos de reemplazo de la hormona tiroidea y: Suda mucho. Tiene temblores. Se siente ansioso. [place] de peso rpidamente. No puede tolerar el calor. Tiene cambios emocionales. Tiene diarrea. Se siente dbil. Solicite ayuda de inmediato si tiene: Dolor de pecho. Latidos cardacos irregulares. Latidos cardacos rpidos. Dificultad para respirar. Resumen El hipotiroidismo ocurre cuando la glndula tiroidea no produce la cantidad suficiente de ciertas hormonas (es hipoactiva). Cuando la tiroides es hipoactiva, produce muy poca cantidad de las hormonas tiroxina (T4) y triyodotironina (T3). La causa ms frecuente es la enfermedad de Hashimoto, una enfermedad por la cual el sistema del cuerpo  encargado de combatir las enfermedades (sistema inmunitario) ataca la glndula tiroidea. La afeccin tambin puede ser consecuencia de infecciones virales,   medicamentos, el embarazo o luego de un tratamiento de radioterapia en la cabeza o el cuello. Los sntomas pueden incluir aumento de peso, piel seca, estreimiento, sensacin de no tener energa e incapacidad para tolerar el fro. Esta afeccin se trata con medicamentos que reemplazan las hormonas tiroideas que el cuerpo no produce. Esta informacin no tiene como fin reemplazar el consejo del mdico. Asegresede hacerle al mdico cualquier pregunta que tenga. Document Revised: 05/09/2020 Document Reviewed: 04/12/2020 Elsevier Patient Education  2022 Elsevier Inc.  

## 2021-05-15 NOTE — Progress Notes (Signed)
Tracey Campbell 55 y.o.   Chief Complaint  Patient presents with   Hypothyroidism    F/U    HISTORY OF PRESENT ILLNESS: This is a 55 y.o. female with history of hypothyroidism. During last visit Synthroid dose was increased to 150 mcg daily. Doing well.  Has no complaints or medical concerns today. Lab Results  Component Value Date   TSH 11.48 (H) 11/06/2020     HPI   Prior to Admission medications   Medication Sig Start Date End Date Taking? Authorizing Provider  atorvastatin (LIPITOR) 40 MG tablet TAKE 1 TABLET DAILY 10/14/20   Georgina Quint, MD  hydrocortisone (ANUSOL-HC) 2.5 % rectal cream Place 1 application rectally 2 (two) times daily. 05/08/20   Georgina Quint, MD  levothyroxine (SYNTHROID) 150 MCG tablet Take 1 tablet (150 mcg total) by mouth daily. 11/06/20   Georgina Quint, MD  Omega-3 Fatty Acids (FISH OIL) 1000 MG CPDR Take by mouth daily.    [provider]    No Known Allergies  Patient Active Problem List   Diagnosis Date Noted   History of high cholesterol 09/28/2018   Hypothyroidism 09/28/2018    Past Medical History:  Diagnosis Date   Thyroid disease     Past Surgical History:  Procedure Laterality Date   CARDIAC VALVE REPLACEMENT      Social History   Socioeconomic History   Marital status: Married    Spouse name: Not on file   Number of children: Not on file   Years of education: Not on file   Highest education level: Not on file  Occupational History   Not on file  Tobacco Use   Smoking status: Never   Smokeless tobacco: Never  Vaping Use   Vaping Use: Never used  Substance and Sexual Activity   Alcohol use: Never   Drug use: Never   Sexual activity: Not Currently  Other Topics Concern   Not on file  Social History Narrative   Not on file   Social Determinants of Health   Financial Resource Strain: Not on file  Food Insecurity: Not on file  Transportation Needs: Not on file  Physical  Activity: Not on file  Stress: Not on file  Social Connections: Not on file  Intimate Partner Violence: Not on file    Family History  Problem Relation Age of Onset   Healthy Sister      Review of Systems  Constitutional: Negative.  Negative for chills and fever.  HENT: Negative.  Negative for congestion and sore throat.   Respiratory: Negative.  Negative for cough and hemoptysis.   Cardiovascular: Negative.  Negative for chest pain and palpitations.  Gastrointestinal: Negative.  Negative for abdominal pain, diarrhea, nausea and vomiting.  Genitourinary: Negative.  Negative for dysuria and hematuria.  Musculoskeletal: Negative.   Skin: Negative.  Negative for rash.  Neurological:  Negative for dizziness and headaches.  All other systems reviewed and are negative.   Physical Exam Vitals reviewed.  Constitutional:      Appearance: Normal appearance.  HENT:     Head: Normocephalic.     Mouth/Throat:     Mouth: Mucous membranes are moist.     Pharynx: Oropharynx is clear.  Eyes:     Extraocular Movements: Extraocular movements intact.     Conjunctiva/sclera: Conjunctivae normal.     Pupils: Pupils are equal, round, and reactive to light.  Neck:     Vascular: No carotid bruit.  Cardiovascular:     Rate  and Rhythm: Normal rate and regular rhythm.     Pulses: Normal pulses.     Heart sounds: Normal heart sounds.  Pulmonary:     Effort: Pulmonary effort is normal.     Breath sounds: Normal breath sounds.  Musculoskeletal:        General: Normal range of motion.     Cervical back: Normal range of motion and neck supple. No tenderness.     Right lower leg: No edema.     Left lower leg: No edema.  Lymphadenopathy:     Cervical: No cervical adenopathy.  Skin:    General: Skin is warm and dry.     Capillary Refill: Capillary refill takes less than 2 seconds.  Neurological:     General: No focal deficit present.     Mental Status: She is alert and oriented to person,  place, and time.  Psychiatric:        Mood and Affect: Mood normal.        Behavior: Behavior normal.     ASSESSMENT & PLAN: Problem List Items Addressed This Visit       Endocrine   Hypothyroidism - Primary    Clinically stable.  On Synthroid 150 mcg daily. Will do TSH level today.      Relevant Orders   TSH     Other   Dyslipidemia    Diet and nutrition discussed.  Continue atorvastatin 40 mg daily. The 10-year ASCVD risk score (Arnett DK, et al., 2019) is: 2.2%   Values used to calculate the score:     Age: 54 years     Sex: Female     Is Non-Hispanic African American: No     Diabetic: No     Tobacco smoker: No     Systolic Blood Pressure: 124 mmHg     Is BP treated: No     HDL Cholesterol: 50.2 mg/dL     Total Cholesterol: 199 mg/dL       Patient Instructions  Hipotiroidismo Hypothyroidism El hipotiroidismo ocurre cuando la glndula tiroidea no produce la cantidad suficiente de ciertas hormonas (es hipoactiva). La glndula tiroidea es una pequea glndula ubicada en la parte delantera inferior del cuello, justo delante de la trquea. Esta glndula produce hormonas que ayudan a Scientist, physiological forma en la que el cuerpo [place] los alimentos para obtener energa (metabolismo) as como tambin la funcin cardaca y la funcin cerebral. Estas hormonas tambin juegan un papel para [employer] huesos fuertes. Cuando la tiroides es hipoactiva, produce muy poca cantidad de las hormonas tiroxina (T4) y triyodotironina (T3). Cules son las causas? Esta afeccin puede ser causada por lo siguiente: Enfermedad de Hashimoto. Se trata de una enfermedad por la cual el sistema del cuerpo encargado de combatir las enfermedades (sistema inmunitario) ataca la glndula tiroidea. Esta es la causa ms frecuente. Infecciones virales. Embarazo. Ciertos medicamentos. Defectos congnitos. Radioterapias anteriores en la cabeza o el cuello para Management consultant. Tratamiento previo con yodo  radioactivo. Exposicin a la radiacin en el ambiente, en el pasado. Extirpacin quirrgica previa de una parte o de toda la tiroides. Problemas con Neomia Dear glndula ubicada en el centro del cerebro (hipfisis). Falta de una cantidad suficiente de yodo en la dieta. Qu incrementa el riesgo? Es ms probable que sufra esta afeccin si: Es mujer. Tiene antecedentes familiares de afecciones tiroideas. [place] un medicamento denominado litio. Toma medicamentos que afectan el sistema inmunitario (inmunosupresores). Cules son los signos o sntomas? Los sntomas de esta afeccin incluyen: [employer]  de falta de energa (letargo). Incapacidad para tolerar el fro. Aumento de peso que no puede explicarse por un cambio en la dieta o en los hbitos de ejercicio fsico. Falta de apetito. Piel seca. Pelo grueso. Irregularidades menstruales. Ralentizacin de los procesos de pensamiento. Estreimiento. Tristeza o depresin. Cmo se diagnostica? Esta afeccin se puede diagnosticar en funcin de lo siguiente: Los sntomas, los antecedentes mdicos y un examen fsico. Anlisis de Retail buyer. Tambin puede someterse a estudios por imgenes como una ecografa o una resonancia magntica (RM). Cmo se trata? Esta afeccin se trata con medicamentos que reemplazan las hormonas tiroideas que el cuerpo no produce. Despus de Microbiologist, pueden pasar varias semanas hasta la desaparicin de los sntomas. Siga estas instrucciones en su casa: Use los medicamentos de venta libre y los recetados solamente como se lo haya indicado el mdico. Si empieza a tomar medicamentos nuevos, infrmele al mdico. Oceanographer a todas las visitas de seguimiento como se lo haya indicado el mdico. Esto es importante. A medida que la afeccin mejora, es posible que haya que modificar las dosis de los medicamentos de hormona tiroidea. Tendr que hacerse anlisis de sangre peridicamente, de modo que el mdico pueda Designer, industrial/product. Comunquese con un mdico si: Los sntomas no mejoran con Scientist, research (medical). Est tomando medicamentos de reemplazo de la hormona tiroidea y: Wendall Stade. Tiene temblores. Se siente ansioso. [place] de peso rpidamente. No puede Patent examiner. Tiene cambios emocionales. Tiene diarrea. Se siente dbil. Solicite ayuda de inmediato si tiene: Dolor de Hotel manager. Latidos cardacos irregulares. Latidos cardacos rpidos. Dificultad para respirar. Resumen El hipotiroidismo ocurre cuando la glndula tiroidea no produce la cantidad suficiente de ciertas hormonas (es hipoactiva). Cuando la tiroides es hipoactiva, produce muy poca cantidad de las hormonas tiroxina (T4) y triyodotironina (T3). La causa ms frecuente es la enfermedad de Hashimoto, una enfermedad por la cual el sistema del cuerpo encargado de combatir las enfermedades (sistema inmunitario) ataca la glndula tiroidea. La afeccin tambin puede ser consecuencia de infecciones virales, medicamentos, el embarazo o luego de un tratamiento de radioterapia en la cabeza o el cuello. Los sntomas pueden incluir aumento de [place], piel seca, estreimiento, sensacin de no tener energa e incapacidad para [employer] fro. Esta afeccin se trata con medicamentos que reemplazan las hormonas tiroideas que el cuerpo no produce. Esta informacin no tiene Theme park manager el consejo del mdico. Asegrese de hacerle al mdico cualquier pregunta que tenga. Document Revised: 05/09/2020 Document Reviewed: 04/12/2020 Elsevier Patient Education  2022 Elsevier Inc.    Edwina Barth, MD [facility]

## 2021-07-22 IMAGING — MG DIGITAL SCREENING BILAT W/ CAD
4 series · 4 of 4 positions shown · non-contrast
Comparison: Previous exam(s).

CLINICAL DATA: Screening.

EXAM:
DIGITAL SCREENING BILATERAL MAMMOGRAM WITH CAD

[L CC]
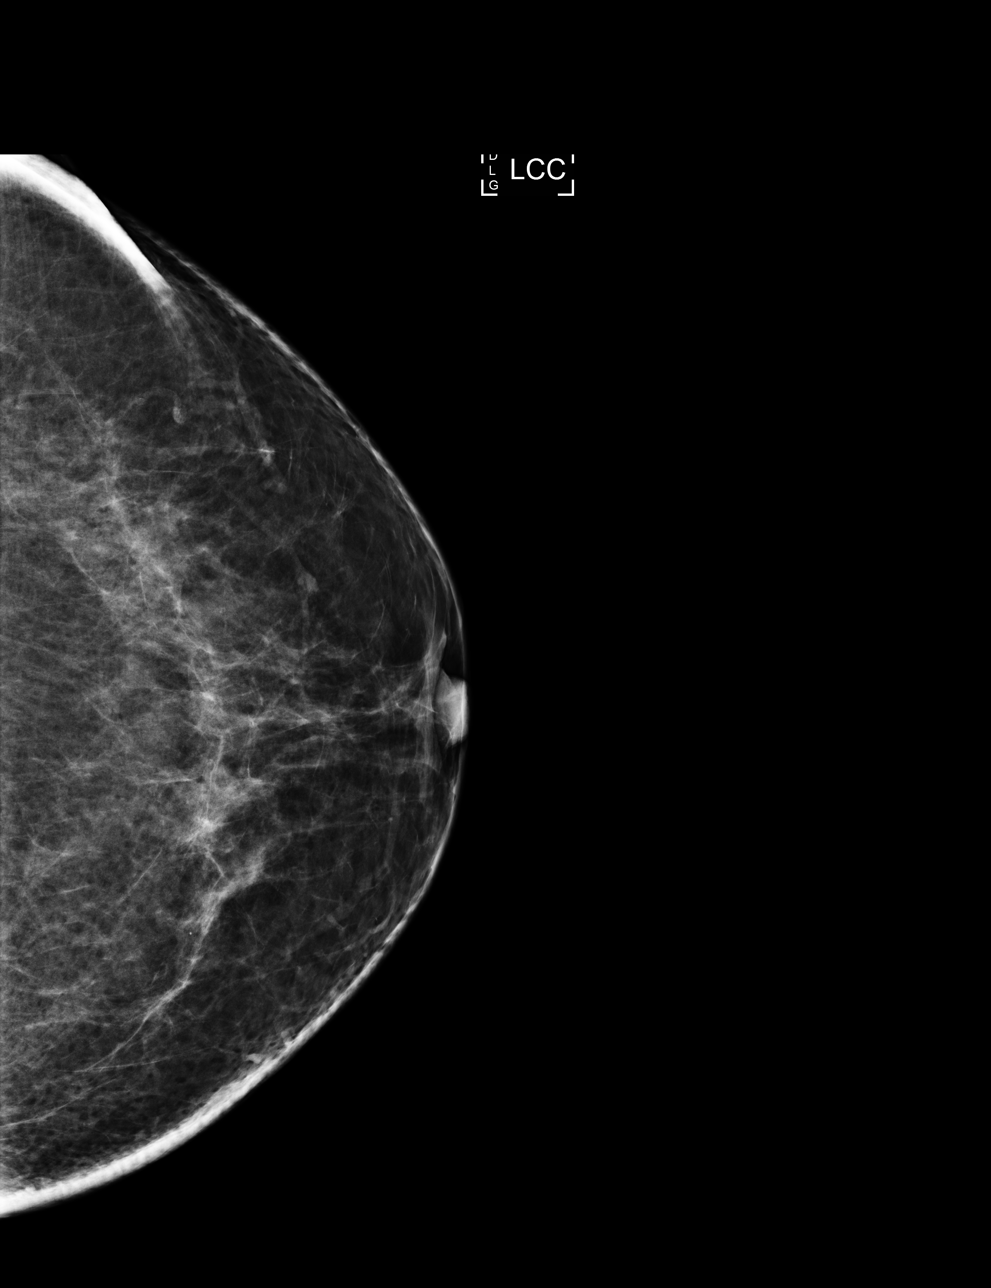

[R MLO]
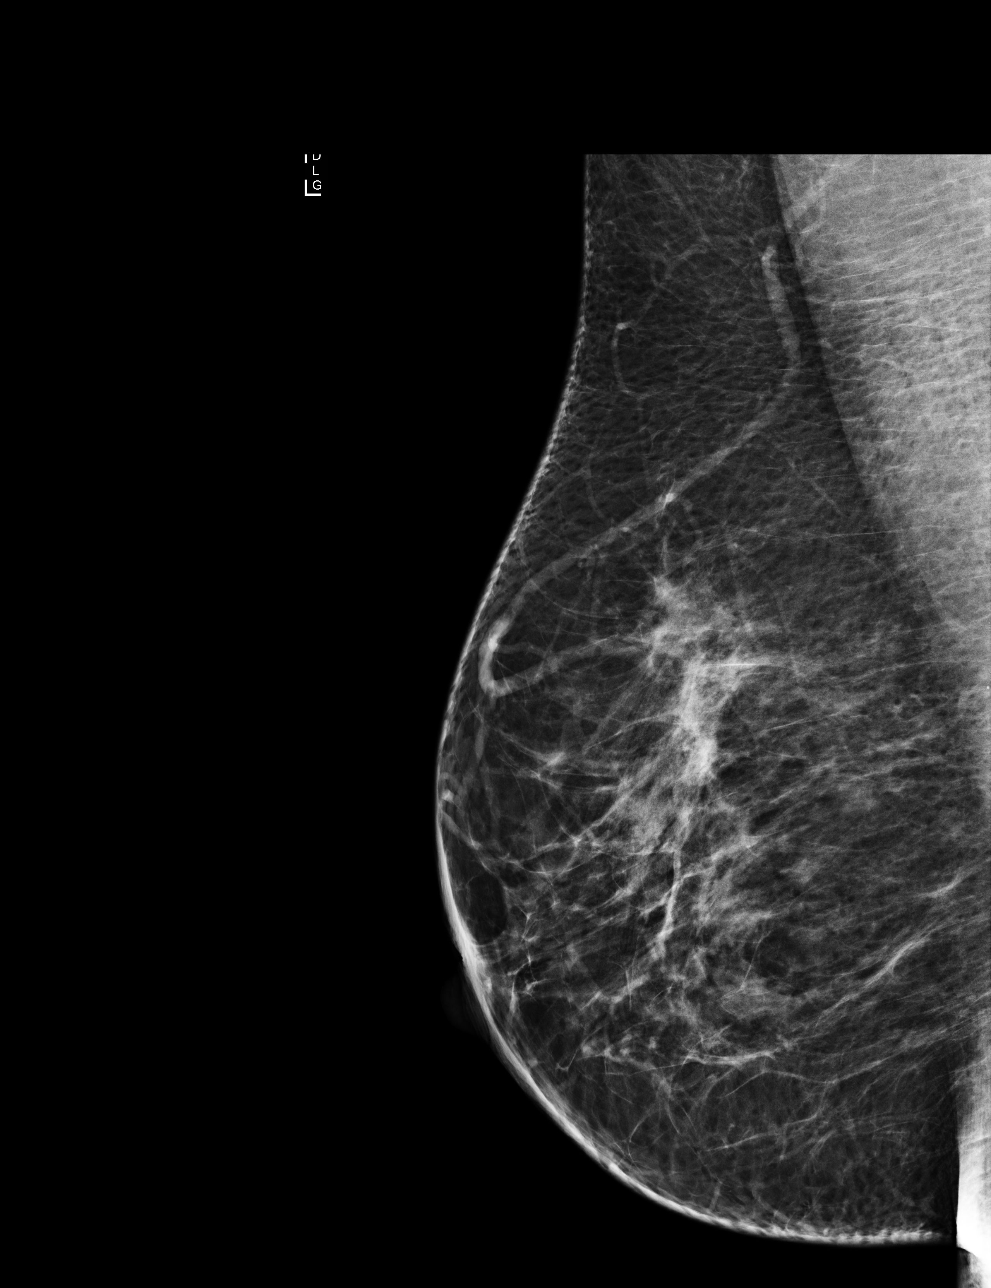

[L MLO]
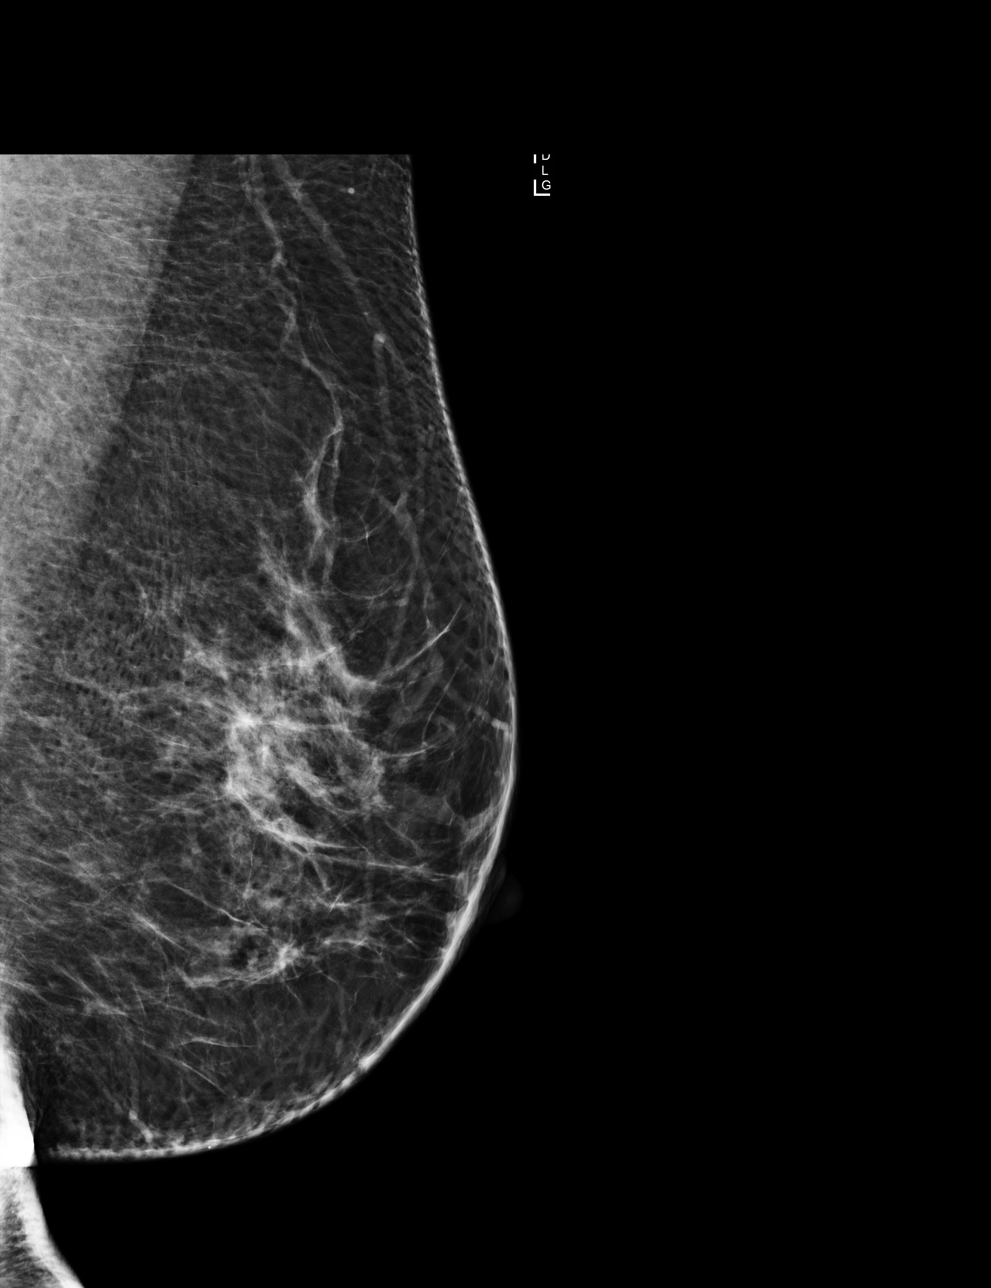

[R CC]
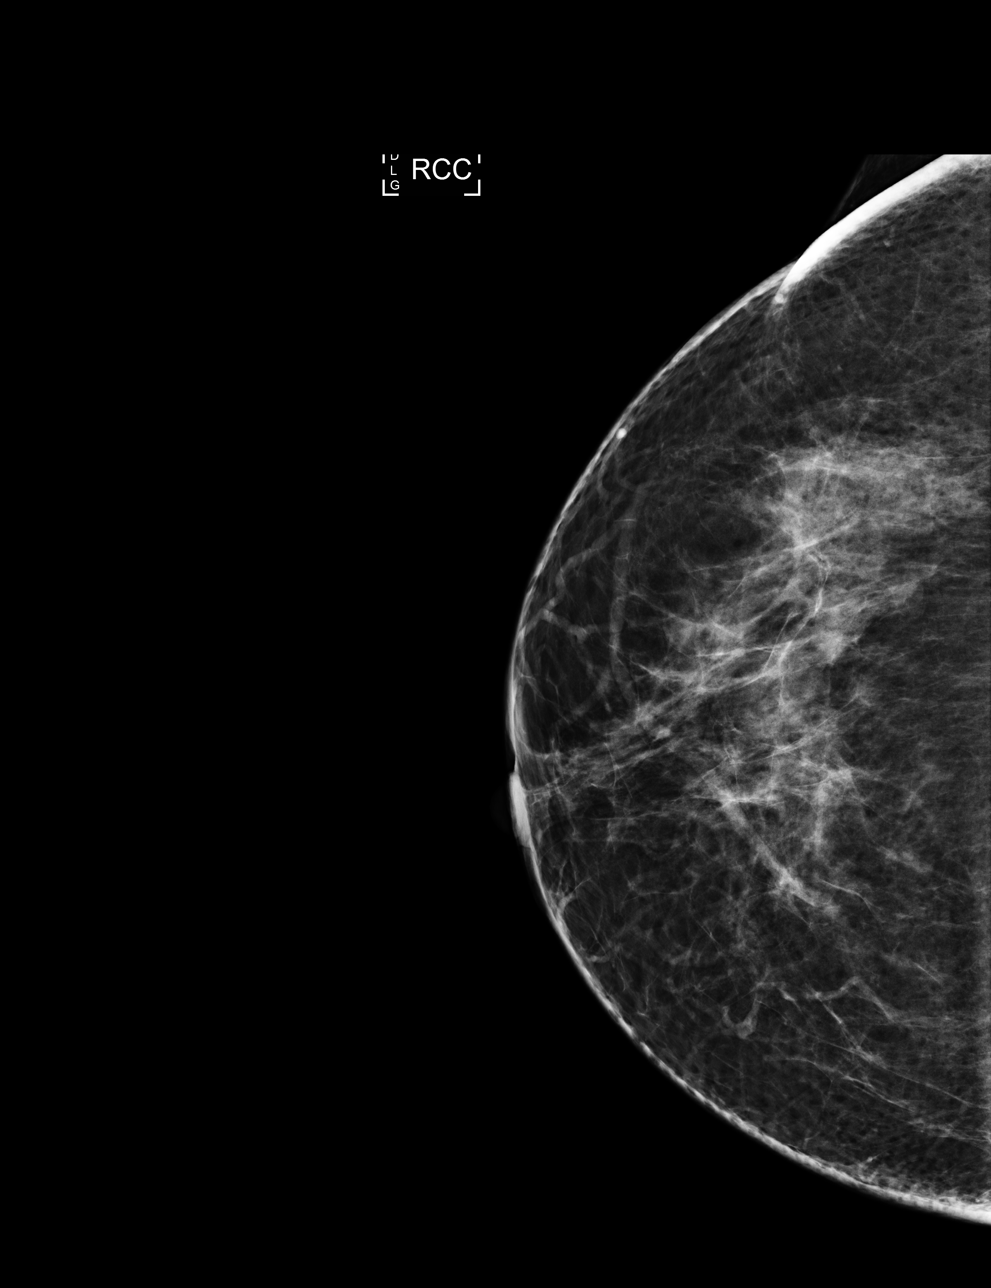

[4 of 4 positions shown; findings below may reference images not displayed]

ACR Breast Density Category b: There are scattered areas of
fibroglandular density.
FINDINGS: There are no findings suspicious for malignancy. Images were
processed with CAD.
IMPRESSION: No mammographic evidence of malignancy. A result letter of this
screening mammogram will be mailed directly to the patient.

RECOMMENDATION:
Screening mammogram in one year. (Code:AS-G-LCT)

BI-RADS CATEGORY  1: Negative.

## 2021-10-09 ENCOUNTER — Other Ambulatory Visit: Payer: Self-pay | Admitting: Emergency Medicine

## 2021-10-09 DIAGNOSIS — E785 Hyperlipidemia, unspecified: Secondary | ICD-10-CM

## 2021-11-13 ENCOUNTER — Ambulatory Visit (INDEPENDENT_AMBULATORY_CARE_PROVIDER_SITE_OTHER): Payer: 59 | Admitting: Emergency Medicine

## 2021-11-13 ENCOUNTER — Encounter: Payer: Self-pay | Admitting: Emergency Medicine

## 2021-11-13 VITALS — BP 108/70 | HR 69 | Temp 98.3°F | Ht 64.0 in | Wt 190.0 lb

## 2021-11-13 DIAGNOSIS — R7303 Prediabetes: Secondary | ICD-10-CM | POA: Diagnosis not present

## 2021-11-13 DIAGNOSIS — E039 Hypothyroidism, unspecified: Secondary | ICD-10-CM | POA: Diagnosis not present

## 2021-11-13 DIAGNOSIS — E785 Hyperlipidemia, unspecified: Secondary | ICD-10-CM

## 2021-11-13 DIAGNOSIS — K644 Residual hemorrhoidal skin tags: Secondary | ICD-10-CM | POA: Diagnosis not present

## 2021-11-13 LAB — COMPREHENSIVE METABOLIC PANEL
ALT: 35 U/L (ref 0–35)
AST: 28 U/L (ref 0–37)
Albumin: 4.3 g/dL (ref 3.5–5.2)
Alkaline Phosphatase: 57 U/L (ref 39–117)
BUN: 11 mg/dL (ref 6–23)
CO2: 28 mEq/L (ref 19–32)
Calcium: 9.2 mg/dL (ref 8.4–10.5)
Chloride: 104 mEq/L (ref 96–112)
Creatinine, Ser: 0.65 mg/dL (ref 0.40–1.20)
GFR: 97.96 mL/min (ref >60.00)
Glucose, Bld: 99 mg/dL (ref 70–99)
Potassium: 3.6 mEq/L (ref 3.5–5.1)
Sodium: 139 mEq/L (ref 135–145)
Total Bilirubin: 0.3 mg/dL (ref 0.2–1.2)
Total Protein: 7.2 g/dL (ref 6.0–8.3)

## 2021-11-13 LAB — LIPID PANEL
Cholesterol: 255 mg/dL — ABNORMAL HIGH (ref 0–200)
HDL: 51.2 mg/dL (ref >39.00)
NonHDL: 203.74
Total CHOL/HDL Ratio: 5
Triglycerides: 296 mg/dL — ABNORMAL HIGH (ref 0.0–149.0)
VLDL: 59.2 mg/dL — ABNORMAL HIGH (ref 0.0–40.0)

## 2021-11-13 LAB — HEMOGLOBIN A1C: Hgb A1c MFr Bld: 5.9 % (ref 4.6–6.5)

## 2021-11-13 LAB — TSH: TSH: 2.81 u[IU]/mL (ref 0.35–5.50)

## 2021-11-13 LAB — LDL CHOLESTEROL, DIRECT: Direct LDL: 166 mg/dL

## 2021-11-13 MED ORDER — HYDROCORTISONE (PERIANAL) 2.5 % EX CREA: 1.0000 "application " | TOPICAL_CREAM | Freq: Two times a day (BID) | CUTANEOUS | 0 refills | Status: DC

## 2021-11-13 MED ORDER — LEVOTHYROXINE SODIUM 150 MCG PO TABS: 150.0000 ug | ORAL_TABLET | Freq: Every day | ORAL | 3 refills | Status: DC

## 2021-11-13 NOTE — Patient Instructions (Signed)

## 2021-11-13 NOTE — Assessment & Plan Note (Signed)
Clinically euthyroid.  TSH done today. ?Continue Synthroid 150 mcg daily. ?

## 2021-11-13 NOTE — Progress Notes (Signed)
Tracey Campbell ?56 y.o. ? ? ?Chief Complaint  ?Patient presents with  ? Follow-up  ?  No concerns  ? ? ?HISTORY OF PRESENT ILLNESS: ?This is a 56 y.o. female with history of hypothyroidism and dyslipidemia here for follow-up. ?Doing well.  Compliant with medications. ?Has no complaints or any other medical concerns. ?Lab Results  ?Component Value Date  ? TSH 1.63 05/15/2021  ? ?The 10-year ASCVD risk score (Arnett DK, et al., 2019) is: 1.9% ?  Values used to calculate the score: ?    Age: 51 years ?    Sex: Female ?    Is Non-Hispanic African American: No ?    Diabetic: No ?    Tobacco smoker: No ?    Systolic Blood Pressure: 108 mmHg ?    Is BP treated: No ?    HDL Cholesterol: 50.2 mg/dL ?    Total Cholesterol: 199 mg/dL ? ? ?HPI ? ? ?Prior to Admission medications   ?Medication Sig Start Date End Date Taking? Authorizing Provider  ?atorvastatin (LIPITOR) 40 MG tablet TAKE 1 TABLET DAILY 10/09/21   Georgina Quint, MD  ?hydrocortisone (ANUSOL-HC) 2.5 % rectal cream Place 1 application rectally 2 (two) times daily. 05/08/20   Georgina Quint, MD  ?levothyroxine (SYNTHROID) 150 MCG tablet Take 1 tablet (150 mcg total) by mouth daily. 11/06/20   Georgina Quint, MD  ?Omega-3 Fatty Acids (FISH OIL) 1000 MG CPDR Take by mouth daily.    [provider]  ? ? ?No Known Allergies ? ?Patient Active Problem List  ? Diagnosis Date Noted  ? Dyslipidemia 05/15/2021  ? History of high cholesterol 09/28/2018  ? Hypothyroidism 09/28/2018  ? ? ?Past Medical History:  ?Diagnosis Date  ? Thyroid disease   ? ? ?Past Surgical History:  ?Procedure Laterality Date  ? CARDIAC VALVE REPLACEMENT    ? ? ?Social History  ? ?Socioeconomic History  ? Marital status: Married  ?  Spouse name: Not on file  ? Number of children: Not on file  ? Years of education: Not on file  ? Highest education level: Not on file  ?Occupational History  ? Not on file  ?Tobacco Use  ? Smoking status: Never  ? Smokeless tobacco: Never   ?Vaping Use  ? Vaping Use: Never used  ?Substance and Sexual Activity  ? Alcohol use: Never  ? Drug use: Never  ? Sexual activity: Not Currently  ?Other Topics Concern  ? Not on file  ?Social History Narrative  ? Not on file  ? ?Social Determinants of Health  ? ?Financial Resource Strain: Not on file  ?Food Insecurity: Not on file  ?Transportation Needs: Not on file  ?Physical Activity: Not on file  ?Stress: Not on file  ?Social Connections: Not on file  ?Intimate Partner Violence: Not on file  ? ? ?Family History  ?Problem Relation Age of Onset  ? Healthy Sister   ? ? ? ?Review of Systems  ?Constitutional: Negative.  Negative for chills and fever.  ?HENT: Negative.  Negative for congestion and sore throat.   ?Respiratory: Negative.  Negative for cough and shortness of breath.   ?Cardiovascular: Negative.  Negative for chest pain and palpitations.  ?Gastrointestinal:  Negative for abdominal pain, diarrhea, nausea and vomiting.  ?Genitourinary: Negative.   ?Musculoskeletal: Negative.   ?Skin: Negative.  Negative for rash.  ?Neurological:  Negative for dizziness and headaches.  ?All other systems reviewed and are negative. ? ?Vitals:  ? 11/13/21 1451  ?BP:  108/70  ?Pulse: 69  ?Temp: 98.3 ?F (36.8 ?C)  ?SpO2: 96%  ? ?Wt Readings from Last 3 Encounters:  ?11/13/21 190 lb (86.2 kg)  ?05/15/21 191 lb (86.6 kg)  ?12/26/20 189 lb (85.7 kg)  ? ? ?Physical Exam ?Vitals reviewed.  ?Constitutional:   ?   Appearance: Normal appearance.  ?HENT:  ?   Head: Normocephalic.  ?Eyes:  ?   Extraocular Movements: Extraocular movements intact.  ?   Pupils: Pupils are equal, round, and reactive to light.  ?Cardiovascular:  ?   Rate and Rhythm: Normal rate and regular rhythm.  ?   Pulses: Normal pulses.  ?   Heart sounds: Normal heart sounds.  ?Pulmonary:  ?   Effort: Pulmonary effort is normal.  ?   Breath sounds: Normal breath sounds.  ?Abdominal:  ?   General: There is no distension.  ?   Palpations: Abdomen is soft.  ?   Tenderness:  There is no abdominal tenderness.  ?Musculoskeletal:     ?   General: Normal range of motion.  ?   Cervical back: No tenderness.  ?Lymphadenopathy:  ?   Cervical: No cervical adenopathy.  ?Skin: ?   General: Skin is warm and dry.  ?   Capillary Refill: Capillary refill takes less than 2 seconds.  ?Neurological:  ?   General: No focal deficit present.  ?   Mental Status: She is alert and oriented to person, place, and time.  ?Psychiatric:     ?   Mood and Affect: Mood normal.     ?   Behavior: Behavior normal.  ? ? ?ASSESSMENT & PLAN: ?Problem List Items Addressed This Visit   ? ?  ? Endocrine  ? Hypothyroidism - Primary  ?  Clinically euthyroid.  TSH done today. ?Continue Synthroid 150 mcg daily. ? ?  ?  ? Relevant Medications  ? levothyroxine (SYNTHROID) 150 MCG tablet  ? Other Relevant Orders  ? TSH  ?  ? Other  ? Dyslipidemia  ?  Stable.  Diet and nutrition discussed. ?Continue atorvastatin 40 mg daily. ?The 10-year ASCVD risk score (Arnett DK, et al., 2019) is: 1.9% ?  Values used to calculate the score: ?    Age: 31 years ?    Sex: Female ?    Is Non-Hispanic African American: No ?    Diabetic: No ?    Tobacco smoker: No ?    Systolic Blood Pressure: 108 mmHg ?    Is BP treated: No ?    HDL Cholesterol: 50.2 mg/dL ?    Total Cholesterol: 199 mg/dL ? ? ?  ?  ? Relevant Orders  ? Comprehensive metabolic panel  ? Lipid panel  ? ?Other Visit Diagnoses   ? ? Prediabetes      ? Relevant Orders  ? Comprehensive metabolic panel  ? Hemoglobin A1c  ? External hemorrhoids      ? Relevant Medications  ? hydrocortisone (ANUSOL-HC) 2.5 % rectal cream  ? ?  ? ?Patient Instructions  ?Mantenimiento de Jacobs Engineeringla salud en las mujeres ?Health Maintenance, Female ?Adoptar un estilo de vida saludable y recibir atenci?n preventiva son importantes para promover la salud y Counsellorel bienestar. Consulte al m?dico sobre: ?El esquema adecuado para Mintohacerse pruebas y ex?menes peri?dicos. ?Cosas que puede hacer por su cuenta para prevenir enfermedades y  [place]thensmantenerse sano. ??Qu? debo saber sobre la dieta, el peso y el ejercicio? ?Consuma una dieta saludable ? ?Consuma una dieta que incluya muchas verduras, frutas, productos  l?cteos con bajo contenido de grasa y prote?nas magras. ?No consuma muchos alimentos ricos en grasas s?lidas, az?cares agregados o sodio. ?Mantenga un peso saludable ?El ?ndice de masa muscular [facility]) se [place] para identificar problemas de peso. Proporciona una estimaci?n de la grasa corporal bas?ndose en el peso y la altura. Su m?dico puede ayudarle a Engineer, site [facility] y a Personnel officer o Pharmacologist un peso saludable. ?Haga ejercicio con regularidad ?Haga ejercicio con regularidad. Esta es una de las pr?cticas m?s importantes que puede hacer por su salud. La mayor?a de los adultos deben seguir estas pautas: ?Realizar, al menos, 150 minutos de [place] f?sica por semana. El ejercicio debe aumentar la frecuencia card?aca y Media planner transpirar (ejercicio de intensidad moderada). ?Hacer ejercicios de fortalecimiento por lo [employer] por semana. Agregue esto a su plan de ejercicio de intensidad moderada. ?Pase menos tiempo sentada. Incluso la actividad f?sica ligera puede ser beneficiosa. ?Controle sus niveles de colesterol y l?pidos en la sangre ?Comience a realizarse an?lisis de l?pidos y Oncologist en la sangre a los 20 a?os y luego rep?talos cada 5 a?os. ?H?gase controlar los niveles de colesterol con mayor frecuencia si: ?Sus niveles de l?pidos y colesterol son altos. ?Es mayor de 40 a?os. ?Presenta un alto riesgo de padecer enfermedades card?acas. ??Qu? debo saber sobre las pruebas de detecci?n del c?ncer? ?Seg?n su historia cl?nica y sus antecedentes familiares, es posible que deba realizarse pruebas de detecci?n del c?ncer en diferentes edades. Esto puede incluir pruebas de detecci?n de lo siguiente: ?C?ncer de mama. ?C?ncer de cuello uterino. ?C?ncer colorrectal. ?C?ncer de piel. ?C?ncer de pulm?n. ??Qu? debo saber sobre la enfermedad card?aca,  la diabetes y la hipertensi?n arterial? ?Presi?n arterial y enfermedad card?aca ?La hipertensi?n arterial causa enfermedades card?acas y [place] el riesgo de accidente cerebrovascular. Es m?s probable que est

## 2021-11-13 NOTE — Assessment & Plan Note (Signed)
Stable.  Diet and nutrition discussed. ?Continue atorvastatin 40 mg daily. ?The 10-year ASCVD risk score (Arnett DK, et al., 2019) is: 1.9% ?  Values used to calculate the score: ?    Age: 56 years ?    Sex: Female ?    Is Non-Hispanic African American: No ?    Diabetic: No ?    Tobacco smoker: No ?    Systolic Blood Pressure: 108 mmHg ?    Is BP treated: No ?    HDL Cholesterol: 50.2 mg/dL ?    Total Cholesterol: 199 mg/dL ? ?

## 2021-12-29 ENCOUNTER — Ambulatory Visit: Payer: 59 | Admitting: Nurse Practitioner

## 2022-01-08 ENCOUNTER — Other Ambulatory Visit: Payer: Self-pay | Admitting: Emergency Medicine

## 2022-01-08 DIAGNOSIS — E039 Hypothyroidism, unspecified: Secondary | ICD-10-CM

## 2022-01-19 ENCOUNTER — Ambulatory Visit: Payer: 59 | Admitting: Nurse Practitioner

## 2022-01-19 NOTE — Progress Notes (Deleted)
   Tracey Campbell 1966-04-22 [id number]   History:  56 y.o. G3P0003 presents for annual exam. Postmenopausal - no HRT, no bleeding. Normal pap and mammogram history. Hypothyroidism managed by PCP. Complains of stress incontinence that is not new for her. It is mostly with lifting at work.   Gynecologic History No LMP recorded. Patient is postmenopausal.   Contraception/Family planning: post menopausal status Sexually active: Yes  Health Maintenance Last Pap: 12/26/2019. Results were: Normal, 5-year repeat Last mammogram: 12/25/2019. Results were: Normal Last colonoscopy: Never. Negative Cologuard 2021 Last Dexa: Not indicated  Past medical history, past surgical history, family history and social history were all reviewed and documented in the EPIC chart. Married. 3 children in their 30s, 2 grandchildren ages 2 and 87, one on the way. Works in Development worker, community at [employer].   ROS:  A ROS was performed and pertinent positives and negatives are included.  Exam:  There were no vitals filed for this visit.  There is no height or weight on file to calculate BMI.  General appearance:  Normal Thyroid:  Symmetrical, normal in size, without palpable masses or nodularity. Respiratory  Auscultation:  Clear without wheezing or rhonchi Cardiovascular  Auscultation:  Regular rate, without rubs, murmurs or gallops  Edema/varicosities:  Not grossly evident Abdominal  Soft,nontender, without masses, guarding or rebound.  Liver/spleen:  No organomegaly noted  Hernia:  None appreciated  Skin  Inspection:  Grossly normal Breasts: Examined lying and sitting.   Right: Without masses, retractions, nipple discharge or axillary adenopathy.   Left: Without masses, retractions, nipple discharge or axillary adenopathy. Genitourinary   Inguinal/mons:  Normal without inguinal adenopathy  External genitalia:  Normal appearing vulva with no masses, tenderness, or lesions  BUS/Urethra/Skene's glands:   Normal  Vagina:  Normal appearing with normal color and discharge, no lesions  Cervix:  Normal appearing without discharge or lesions  Uterus:  Normal in size, shape and contour.  Midline and mobile, nontender  Adnexa/parametria:     Rt: Normal in size, without masses or tenderness.   Lt: Normal in size, without masses or tenderness.  Anus and perineum: Normal  Digital rectal exam: Normal sphincter tone without palpated masses or tenderness  Patient informed chaperone available to be present for breast and pelvic exam. Patient has requested no chaperone to be present. Patient has been advised what will be completed during breast and pelvic exam.   Assessment/Plan:  56 y.o. G3P0003 for annual exam.   Well female exam with routine gynecological exam - Education provided on SBEs, importance of preventative screenings, current guidelines, high calcium diet, regular exercise, and multivitamin daily. Labs with PCP.   Postmenopausal - no HRT, no bleeding.   Stress incontinence - this is not new for her and has not worsened. We talked about lifestyle changes and weight loss to help with symptoms. If symptoms persist we discussed option for pelvic floor PT.   Screening for cervical cancer - Normal Pap history.  Will repeat at 5-year interval per guidelines.  Screening for breast cancer - Normal mammogram history. Overdue and encouraged to schedule now. Continue annual screenings.  Normal breast exam today.  Screening for colon cancer - Negative cologuard in 2021.   Screening for osteoporosis - Average risk. Will plan DXA at age 29.   Return in 1 year for annual.    Olivia Mackie DNP, 10:36 AM 01/19/2022

## 2022-02-03 ENCOUNTER — Ambulatory Visit: Payer: 59 | Admitting: Nurse Practitioner

## 2022-05-11 ENCOUNTER — Ambulatory Visit: Payer: 59 | Admitting: Emergency Medicine

## 2022-05-13 ENCOUNTER — Ambulatory Visit: Payer: 59 | Admitting: Emergency Medicine

## 2022-05-14 ENCOUNTER — Encounter: Payer: Self-pay | Admitting: Emergency Medicine

## 2022-05-14 ENCOUNTER — Ambulatory Visit (INDEPENDENT_AMBULATORY_CARE_PROVIDER_SITE_OTHER): Payer: 59 | Admitting: Emergency Medicine

## 2022-05-14 VITALS — BP 124/76 | HR 65 | Temp 98.6°F | Ht 64.0 in | Wt 185.5 lb

## 2022-05-14 DIAGNOSIS — Z23 Encounter for immunization: Secondary | ICD-10-CM

## 2022-05-14 DIAGNOSIS — Z1239 Encounter for other screening for malignant neoplasm of breast: Secondary | ICD-10-CM | POA: Diagnosis not present

## 2022-05-14 DIAGNOSIS — Z1159 Encounter for screening for other viral diseases: Secondary | ICD-10-CM

## 2022-05-14 DIAGNOSIS — Z114 Encounter for screening for human immunodeficiency virus [HIV]: Secondary | ICD-10-CM

## 2022-05-14 DIAGNOSIS — E785 Hyperlipidemia, unspecified: Secondary | ICD-10-CM

## 2022-05-14 DIAGNOSIS — E039 Hypothyroidism, unspecified: Secondary | ICD-10-CM

## 2022-05-14 DIAGNOSIS — R7303 Prediabetes: Secondary | ICD-10-CM | POA: Insufficient documentation

## 2022-05-14 LAB — CBC WITH DIFFERENTIAL/PLATELET
Basophils Absolute: 0.1 10*3/uL (ref 0.0–0.1)
Basophils Relative: 0.6 % (ref 0.0–3.0)
Eosinophils Absolute: 0.1 10*3/uL (ref 0.0–0.7)
Eosinophils Relative: 1.5 % (ref 0.0–5.0)
HCT: 39.9 % (ref 36.0–46.0)
Hemoglobin: 13.3 g/dL (ref 12.0–15.0)
Lymphocytes Relative: 41.9 % (ref 12.0–46.0)
Lymphs Abs: 4 10*3/uL (ref 0.7–4.0)
MCHC: 33.4 g/dL (ref 30.0–36.0)
MCV: 95.8 fl (ref 78.0–100.0)
Monocytes Absolute: 0.6 10*3/uL (ref 0.1–1.0)
Monocytes Relative: 6.5 % (ref 3.0–12.0)
Neutro Abs: 4.7 10*3/uL (ref 1.4–7.7)
Neutrophils Relative %: 49.5 % (ref 43.0–77.0)
Platelets: 307 10*3/uL (ref 150.0–400.0)
RBC: 4.16 Mil/uL (ref 3.87–5.11)
RDW: 13.4 % (ref 11.5–15.5)
WBC: 9.6 10*3/uL (ref 4.0–10.5)

## 2022-05-14 LAB — COMPREHENSIVE METABOLIC PANEL
ALT: 30 U/L (ref 0–35)
AST: 28 U/L (ref 0–37)
Albumin: 4.5 g/dL (ref 3.5–5.2)
Alkaline Phosphatase: 60 U/L (ref 39–117)
BUN: 16 mg/dL (ref 6–23)
CO2: 29 mEq/L (ref 19–32)
Calcium: 9.6 mg/dL (ref 8.4–10.5)
Chloride: 102 mEq/L (ref 96–112)
Creatinine, Ser: 0.8 mg/dL (ref 0.40–1.20)
GFR: 81.69 mL/min (ref >60.00)
Glucose, Bld: 93 mg/dL (ref 70–99)
Potassium: 3.9 mEq/L (ref 3.5–5.1)
Sodium: 138 mEq/L (ref 135–145)
Total Bilirubin: 0.5 mg/dL (ref 0.2–1.2)
Total Protein: 7.7 g/dL (ref 6.0–8.3)

## 2022-05-14 LAB — LIPID PANEL
Cholesterol: 237 mg/dL — ABNORMAL HIGH (ref 0–200)
HDL: 50.6 mg/dL (ref >39.00)
NonHDL: 185.98
Total CHOL/HDL Ratio: 5
Triglycerides: 216 mg/dL — ABNORMAL HIGH (ref 0.0–149.0)
VLDL: 43.2 mg/dL — ABNORMAL HIGH (ref 0.0–40.0)

## 2022-05-14 LAB — HEMOGLOBIN A1C: Hgb A1c MFr Bld: 6 % (ref 4.6–6.5)

## 2022-05-14 LAB — TSH: TSH: 10.65 u[IU]/mL — ABNORMAL HIGH (ref 0.35–5.50)

## 2022-05-14 LAB — LDL CHOLESTEROL, DIRECT: Direct LDL: 148 mg/dL

## 2022-05-14 NOTE — Assessment & Plan Note (Signed)
Clinically euthyroid. TSH done today. Continue levothyroxine 150 mcg daily.

## 2022-05-14 NOTE — Assessment & Plan Note (Addendum)
Diet and nutrition discussed.  Hemoglobin A1c done today. 

## 2022-05-14 NOTE — Assessment & Plan Note (Signed)
Diet and nutrition discussed. Cardiovascular risks associated with dyslipidemia discussed. Continue atorvastatin 40 mg daily. The 10-year ASCVD risk score (Arnett DK, et al., 2019) is: 3%   Values used to calculate the score:     Age: 56 years     Sex: Female     Is Non-Hispanic African American: No     Diabetic: No     Tobacco smoker: No     Systolic Blood Pressure: 161 mmHg     Is BP treated: No     HDL Cholesterol: 51.2 mg/dL     Total Cholesterol: 255 mg/dL

## 2022-05-14 NOTE — Progress Notes (Signed)
Tracey Campbell 56 y.o.   Chief Complaint  Patient presents with   Follow-up    f/u appt, patient wants her cholesterol and thyroid checked     HISTORY OF PRESENT ILLNESS: This is a 56 y.o. female here for 6-month follow-up of hypothyroidism, dyslipidemia, and prediabetes. Overall doing well. Has no complaints or any other medical concerns today.  HPI   Prior to Admission medications   Medication Sig Start Date End Date Taking? Authorizing Provider  atorvastatin (LIPITOR) 40 MG tablet TAKE 1 TABLET DAILY 10/09/21  Yes Evadene Wardrip, Eilleen Kempf, MD  levothyroxine (SYNTHROID) 150 MCG tablet TAKE 1 TABLET(150 MCG) BY MOUTH DAILY 01/08/22  Yes Rylan Bernard, Eilleen Kempf, MD  Omega-3 Fatty Acids (FISH OIL) 1000 MG CPDR Take by mouth daily.   Yes [provider]    No Known Allergies  Patient Active Problem List   Diagnosis Date Noted   Dyslipidemia 05/15/2021   History of high cholesterol 09/28/2018   Hypothyroidism 09/28/2018    Past Medical History:  Diagnosis Date   Thyroid disease     Past Surgical History:  Procedure Laterality Date   CARDIAC VALVE REPLACEMENT      Social History   Socioeconomic History   Marital status: Married    Spouse name: Not on file   Number of children: Not on file   Years of education: Not on file   Highest education level: Not on file  Occupational History   Not on file  Tobacco Use   Smoking status: Never   Smokeless tobacco: Never  Vaping Use   Vaping Use: Never used  Substance and Sexual Activity   Alcohol use: Never   Drug use: Never   Sexual activity: Not Currently  Other Topics Concern   Not on file  Social History Narrative   Not on file   Social Determinants of Health   Financial Resource Strain: Not on file  Food Insecurity: Not on file  Transportation Needs: Not on file  Physical Activity: Not on file  Stress: Not on file  Social Connections: Not on file  Intimate Partner Violence: Not on file     Family History  Problem Relation Age of Onset   Healthy Sister      Review of Systems  Constitutional: Negative.  Negative for chills and fever.  HENT: Negative.  Negative for congestion and sore throat.   Respiratory: Negative.  Negative for cough and shortness of breath.   Cardiovascular: Negative.  Negative for chest pain and palpitations.  Gastrointestinal: Negative.  Negative for abdominal pain, diarrhea, nausea and vomiting.  Genitourinary: Negative.   Skin: Negative.  Negative for rash.  Neurological: Negative.  Negative for dizziness and headaches.  All other systems reviewed and are negative.   Today's Vitals   05/14/22 1507  BP: 124/76  Pulse: 65  Temp: 98.6 F (37 C)  TempSrc: Oral  SpO2: 94%  Weight: 185 lb 8 oz (84.1 kg)  Height: 5\' 4"  (1.626 m)   Body mass index is 31.84 kg/m. Wt Readings from Last 3 Encounters:  05/14/22 185 lb 8 oz (84.1 kg)  11/13/21 190 lb (86.2 kg)  05/15/21 191 lb (86.6 kg)    Physical Exam Vitals reviewed.  Constitutional:      Appearance: Normal appearance.  HENT:     Head: Normocephalic.     Mouth/Throat:     Mouth: Mucous membranes are moist.     Pharynx: Oropharynx is clear.  Eyes:     Extraocular Movements:  Extraocular movements intact.     Conjunctiva/sclera: Conjunctivae normal.     Pupils: Pupils are equal, round, and reactive to light.  Cardiovascular:     Rate and Rhythm: Normal rate and regular rhythm.     Pulses: Normal pulses.     Heart sounds: Normal heart sounds.  Pulmonary:     Effort: Pulmonary effort is normal.     Breath sounds: Normal breath sounds.  Musculoskeletal:     Cervical back: No tenderness.  Lymphadenopathy:     Cervical: No cervical adenopathy.  Skin:    General: Skin is warm and dry.  Neurological:     General: No focal deficit present.     Mental Status: She is alert and oriented to person, place, and time.  Psychiatric:        Mood and Affect: Mood normal.        Behavior:  Behavior normal.     ASSESSMENT & PLAN: A total of 44 minutes was spent with the patient and counseling/coordination of care regarding preparing for this visit, review of most recent office visit notes, review of multiple chronic medical problems and their management, review of all medications, review of most recent blood work results, education on nutrition, prognosis, documentation, need for follow-up.  Problem List Items Addressed This Visit       Endocrine   Hypothyroidism - Primary    Clinically euthyroid. TSH done today. Continue levothyroxine 150 mcg daily.      Relevant Orders   CBC with Differential/Platelet   TSH     Other   Dyslipidemia    Diet and nutrition discussed. Cardiovascular risks associated with dyslipidemia discussed. Continue atorvastatin 40 mg daily. The 10-year ASCVD risk score (Arnett DK, et al., 2019) is: 3%   Values used to calculate the score:     Age: 66 years     Sex: Female     Is Non-Hispanic African American: No     Diabetic: No     Tobacco smoker: No     Systolic Blood Pressure: 124 mmHg     Is BP treated: No     HDL Cholesterol: 51.2 mg/dL     Total Cholesterol: 255 mg/dL       Relevant Orders   CBC with Differential/Platelet   Comprehensive metabolic panel   Lipid panel   Prediabetes    Diet and nutrition discussed. Hemoglobin A1c done today.      Relevant Orders   Comprehensive metabolic panel   Hemoglobin A1c   Other Visit Diagnoses     Encounter for screening for malignant neoplasm of breast, unspecified screening modality       Relevant Orders   MM Digital Screening   Need for vaccination       Relevant Orders   Flu Vaccine QUAD 6+ mos PF IM (Fluarix Quad PF) (Completed)   Need for hepatitis C screening test       Relevant Orders   Hepatitis C antibody   Screening for HIV (human immunodeficiency virus)       Relevant Orders   HIV Antibody (routine testing w rflx)      Patient Instructions  Mantenimiento de  la salud en las mujeres Health Maintenance, Female Adoptar un estilo de vida saludable y recibir atencin preventiva son importantes para promover la salud y Counsellor. Consulte al mdico sobre: El esquema adecuado para hacerse pruebas y exmenes peridicos. Cosas que puede hacer por su cuenta para prevenir enfermedades y [place] sano. Qu debo  saber sobre la dieta, el peso y el ejercicio? Consuma una dieta saludable  Consuma una dieta que incluya muchas verduras, frutas, productos lcteos con bajo contenido de [place] y Associate Professor. No consuma muchos alimentos ricos en grasas slidas, azcares agregados o sodio. Mantenga un peso saludable El ndice de masa muscular [facility]) se [place] para identificar problemas de [place]. Proporciona una estimacin de la grasa corporal basndose en el peso y la altura. Su mdico puede ayudarle a Engineer, site IMC y a Personnel officer o Pharmacologist un peso saludable. Haga ejercicio con regularidad Haga ejercicio con regularidad. Esta es una de las prcticas ms importantes que puede hacer por su salud. La [employer] de los adultos deben seguir estas pautas: Education officer, environmental, al menos, 150 minutos de actividad fsica por semana. El ejercicio debe aumentar la frecuencia cardaca y Media planner transpirar (ejercicio de intensidad moderada). Hacer ejercicios de fortalecimiento por lo [employer] por semana. Agregue esto a su plan de ejercicio de intensidad moderada. Pase menos tiempo sentada. Incluso la actividad fsica ligera puede ser beneficiosa. Controle sus niveles de colesterol y lpidos en la sangre Comience a realizarse anlisis de lpidos y Oncologist en la sangre a los 20 aos y luego reptalos cada 5 aos. Hgase controlar los niveles de colesterol con mayor frecuencia si: Sus niveles de lpidos y colesterol son altos. Es mayor de 40 aos. Presenta un alto riesgo de padecer enfermedades cardacas. Qu debo saber sobre las pruebas de deteccin del cncer? Segn su  historia clnica y sus antecedentes familiares, es posible que deba realizarse pruebas de deteccin del cncer en diferentes edades. Esto puede incluir pruebas de deteccin de lo siguiente: Cncer de mama. Cncer de cuello uterino. Cncer colorrectal. Cncer de piel. Cncer de pulmn. Qu debo saber sobre la enfermedad cardaca, la diabetes y la hipertensin arterial? Presin arterial y enfermedad cardaca La hipertensin arterial causa enfermedades cardacas y [place] el riesgo de accidente cerebrovascular. Es ms probable que esto se manifieste en las personas que tienen lecturas de presin arterial alta o tienen sobrepeso. Hgase controlar la presin arterial: Cada 3 a 5 aos si tiene entre 18 y 66 aos. Todos los aos si es mayor de 40 aos. Diabetes Realcese exmenes de deteccin de la diabetes con regularidad. Este anlisis revisa el nivel de azcar en la sangre en [place]. Hgase las pruebas de deteccin: Cada tres aos despus de los 40 aos de edad si tiene un peso normal y un bajo riesgo de padecer diabetes. Con ms frecuencia y a partir de [place] edad inferior si tiene sobrepeso o un alto riesgo de padecer diabetes. Qu debo saber sobre la prevencin de infecciones? Hepatitis B Si tiene un riesgo ms alto de contraer hepatitis B, debe someterse a un examen de deteccin de este virus. Hable con el mdico para averiguar si tiene riesgo de contraer la infeccin por hepatitis B. Hepatitis C Se recomienda el anlisis a: [employer] 1945 y 1965. Todas las personas que tengan un riesgo de haber contrado hepatitis C. Enfermedades de transmisin sexual (ETS) Hgase las pruebas de Airline pilot de ITS, incluidas la gonorrea y la clamidia, si: Es sexualmente activa y es menor de [address]. Es mayor de [address], y Public affairs consultant informa que corre riesgo de tener este tipo de infecciones. La actividad sexual ha cambiado desde que le hicieron la ltima prueba de deteccin y tiene un  riesgo mayor de Warehouse manager clamidia o Copy. Pregntele al mdico si usted tiene riesgo. Pregntele al mdico si usted  tiene un alto riesgo de Museum/gallery curator VIH. El mdico tambin puede recomendarle un medicamento recetado para ayudar a evitar la infeccin por el VIH. Si elige tomar medicamentos para prevenir el VIH, primero debe [employer] de deteccin del VIH. Luego debe hacerse anlisis cada 3 meses mientras est tomando los medicamentos. Embarazo Si est por dejar de Librarian, academic (fase premenopusica) y usted puede quedar [place], busque asesoramiento antes de [place]. Tome de 400 a 800 microgramos (mcg) de cido [employer] si [place]. Pida mtodos de control de la natalidad (anticonceptivos) si desea evitar un embarazo no deseado. Osteoporosis y [place] La osteoporosis es una enfermedad en la que los huesos pierden los minerales y la fuerza por el avance de la edad. El resultado pueden ser fracturas en los [place]. Si tiene 70 aos o ms, o si est en riesgo de sufrir osteoporosis y fracturas, pregunte a su mdico si debe: Hacerse pruebas de deteccin de prdida sea. Tomar un suplemento de calcio o de vitamina D para reducir el riesgo de fracturas. Recibir terapia de reemplazo hormonal (TRH) para tratar los sntomas de la menopausia. Siga estas indicaciones en su casa: Consumo de alcohol No beba alcohol si: Su mdico le indica no hacerlo. Est embarazada, puede estar embarazada o est tratando de [place]. Si bebe alcohol: Limite la cantidad que bebe a lo siguiente: De 0 a 1 bebida por da. Sepa cunta cantidad de alcohol hay en las bebidas que toma. En los [place], una medida equivale a una botella de cerveza de 12 oz (355 ml), un vaso de vino de 5 oz (148 ml) o un vaso de una bebida alcohlica de alta graduacin de 1 oz (44 ml). Estilo de vida No consuma ningn producto que contenga nicotina o tabaco. Estos productos incluyen  cigarrillos, tabaco para Higher education careers adviser y aparatos de vapeo, como los Psychologist, sport and exercise. Si necesita ayuda para dejar de consumir estos productos, consulte al mdico. No consuma drogas. No comparta agujas. Solicite ayuda a su mdico si necesita apoyo o informacin para abandonar las drogas. Indicaciones generales Realcese los estudios de rutina de la salud, dentales y de Public librarian. [place]. Infrmele a su mdico si: Se siente deprimida con frecuencia. Alguna vez ha sido vctima de [place] o no se siente seguro en su casa. Resumen Adoptar un estilo de vida saludable y recibir atencin preventiva son importantes para promover la salud y Musician. Siga las instrucciones del mdico acerca de una dieta saludable, el ejercicio y la realizacin de pruebas o exmenes para Engineer, building services. Siga las instrucciones del mdico con respecto al control del colesterol y la presin arterial. Esta informacin no tiene Marine scientist el consejo del mdico. Asegrese de hacerle al mdico cualquier pregunta que tenga. Document Revised: 12/12/2020 Document Reviewed: 12/12/2020 Elsevier Patient Education  [place], MD [facility]

## 2022-05-14 NOTE — Patient Instructions (Signed)

## 2022-05-15 LAB — HEPATITIS C ANTIBODY: Hepatitis C Ab: NONREACTIVE

## 2022-05-15 LAB — HIV ANTIBODY (ROUTINE TESTING W REFLEX): HIV 1&2 Ab, 4th Generation: NONREACTIVE

## 2022-06-18 ENCOUNTER — Ambulatory Visit
Admission: RE | Admit: 2022-06-18 | Discharge: 2022-06-18 | Disposition: A | Discharge status: Home / Self Care | Payer: 59 | Source: Ambulatory Visit | Attending: Emergency Medicine | Admitting: Emergency Medicine

## 2022-06-18 DIAGNOSIS — Z1239 Encounter for other screening for malignant neoplasm of breast: Secondary | ICD-10-CM

## 2022-10-05 ENCOUNTER — Other Ambulatory Visit: Payer: Self-pay | Admitting: Emergency Medicine

## 2022-10-05 DIAGNOSIS — E785 Hyperlipidemia, unspecified: Secondary | ICD-10-CM

## 2022-11-16 ENCOUNTER — Ambulatory Visit (INDEPENDENT_AMBULATORY_CARE_PROVIDER_SITE_OTHER): Payer: 59 | Admitting: Emergency Medicine

## 2022-11-16 ENCOUNTER — Encounter: Payer: Self-pay | Admitting: Emergency Medicine

## 2022-11-16 VITALS — BP 116/80 | HR 68 | Temp 98.6°F | Ht 64.0 in | Wt 185.5 lb

## 2022-11-16 DIAGNOSIS — E039 Hypothyroidism, unspecified: Secondary | ICD-10-CM

## 2022-11-16 DIAGNOSIS — E785 Hyperlipidemia, unspecified: Secondary | ICD-10-CM | POA: Diagnosis not present

## 2022-11-16 DIAGNOSIS — R7303 Prediabetes: Secondary | ICD-10-CM

## 2022-11-16 DIAGNOSIS — Z1211 Encounter for screening for malignant neoplasm of colon: Secondary | ICD-10-CM

## 2022-11-16 LAB — COMPREHENSIVE METABOLIC PANEL
ALT: 30 U/L (ref 0–35)
AST: 28 U/L (ref 0–37)
Albumin: 4.3 g/dL (ref 3.5–5.2)
Alkaline Phosphatase: 54 U/L (ref 39–117)
BUN: 11 mg/dL (ref 6–23)
CO2: 28 mEq/L (ref 19–32)
Calcium: 9.3 mg/dL (ref 8.4–10.5)
Chloride: 103 mEq/L (ref 96–112)
Creatinine, Ser: 0.87 mg/dL (ref 0.40–1.20)
GFR: 73.61 mL/min (ref >60.00)
Glucose, Bld: 86 mg/dL (ref 70–99)
Potassium: 3.8 mEq/L (ref 3.5–5.1)
Sodium: 139 mEq/L (ref 135–145)
Total Bilirubin: 0.3 mg/dL (ref 0.2–1.2)
Total Protein: 7.3 g/dL (ref 6.0–8.3)

## 2022-11-16 LAB — TSH: TSH: 11.49 u[IU]/mL — ABNORMAL HIGH (ref 0.35–5.50)

## 2022-11-16 LAB — LIPID PANEL
Cholesterol: 218 mg/dL — ABNORMAL HIGH (ref 0–200)
HDL: 46.2 mg/dL (ref >39.00)
NonHDL: 171.54
Total CHOL/HDL Ratio: 5
Triglycerides: 328 mg/dL — ABNORMAL HIGH (ref 0.0–149.0)
VLDL: 65.6 mg/dL — ABNORMAL HIGH (ref 0.0–40.0)

## 2022-11-16 LAB — HEMOGLOBIN A1C: Hgb A1c MFr Bld: 6 % (ref 4.6–6.5)

## 2022-11-16 LAB — LDL CHOLESTEROL, DIRECT: Direct LDL: 132 mg/dL

## 2022-11-16 NOTE — Assessment & Plan Note (Signed)
Diet and nutrition discussed. Cardiovascular risk associated with diabetes and dyslipidemia discussed Benefits of exercise discussed Advised to decrease amount of daily carbohydrate intake and daily calories and increase amount of plant-based protein in her diet

## 2022-11-16 NOTE — Assessment & Plan Note (Signed)
Clinically euthyroid.  TSH done today. ?Continue Synthroid 150 mcg daily. ?

## 2022-11-16 NOTE — Assessment & Plan Note (Signed)
Stable.  Diet and nutrition discussed. Lipid profile done today. The 10-year ASCVD risk score (Arnett DK, et al., 2019) is: 2.8%   Values used to calculate the score:     Age: 57 years     Sex: Female     Is Non-Hispanic African American: No     Diabetic: No     Tobacco smoker: No     Systolic Blood Pressure: 116 mmHg     Is BP treated: No     HDL Cholesterol: 50.6 mg/dL     Total Cholesterol: 237 mg/dL

## 2022-11-16 NOTE — Patient Instructions (Signed)

## 2022-11-16 NOTE — Progress Notes (Signed)
Tracey Campbell 58 y.o.   Chief Complaint  Patient presents with   Medical Management of Chronic Issues    f/u appt, no concerns     HISTORY OF PRESENT ILLNESS: This is a 57 y.o. female here for 82-month follow-up of chronic medical problems including hypothyroidism, prediabetes, and dyslipidemia. Overall doing well.  Has no complaints or medical concerns today.  HPI   Prior to Admission medications   Medication Sig Start Date End Date Taking? Authorizing Provider  atorvastatin (LIPITOR) 40 MG tablet TAKE 1 TABLET DAILY 10/05/22  Yes Martyna Thorns, Eilleen Kempf, MD  levothyroxine (SYNTHROID) 150 MCG tablet TAKE 1 TABLET(150 MCG) BY MOUTH DAILY 01/08/22  Yes Penny Frisbie, Eilleen Kempf, MD  Omega-3 Fatty Acids (FISH OIL) 1000 MG CPDR Take by mouth daily.   Yes [provider]    No Known Allergies  Patient Active Problem List   Diagnosis Date Noted   Prediabetes 05/14/2022   Dyslipidemia 05/15/2021   History of high cholesterol 09/28/2018   Hypothyroidism 09/28/2018    Past Medical History:  Diagnosis Date   Thyroid disease     Past Surgical History:  Procedure Laterality Date   CARDIAC VALVE REPLACEMENT      Social History   Socioeconomic History   Marital status: Married    Spouse name: Not on file   Number of children: Not on file   Years of education: Not on file   Highest education level: Never attended school  Occupational History   Not on file  Tobacco Use   Smoking status: Never   Smokeless tobacco: Never  Vaping Use   Vaping Use: Never used  Substance and Sexual Activity   Alcohol use: Never   Drug use: Never   Sexual activity: Not Currently  Other Topics Concern   Not on file  Social History Narrative   Not on file   Social Determinants of Health   Financial Resource Strain: Low Risk  (11/12/2022)   Overall Financial Resource Strain (CARDIA)    Difficulty of Paying Living Expenses: Not very hard  Food Insecurity: No Food Insecurity  (11/12/2022)   Hunger Vital Sign    Worried About Running Out of Food in the Last Year: Never true    Ran Out of Food in the Last Year: Never true  Transportation Needs: No Transportation Needs (11/12/2022)   PRAPARE - Administrator, Civil Service (Medical): No    Lack of Transportation (Non-Medical): No  Physical Activity: Insufficiently Active (11/12/2022)   Exercise Vital Sign    Days of Exercise per Week: 3 days    Minutes of Exercise per Session: 30 min  Stress: No Stress Concern Present (11/12/2022)   [employer] of Occupational Health - Occupational Stress Questionnaire    Feeling of Stress : Not at all  Social Connections: Unknown (11/12/2022)   Social Connection and Isolation Panel [NHANES]    Frequency of Communication with Friends and Family: Once a week    Frequency of Social Gatherings with Friends and Family: Once a week    Attends Religious Services: More than 4 times per year    Active Member of [employer] or Organizations: Not on file    Attends Banker Meetings: Not on file    Marital Status: Married  Catering manager Violence: Not on file    Family History  Problem Relation Age of Onset   Healthy Sister      Review of Systems  Constitutional: Negative.  Negative for chills  and fever.  HENT: Negative.  Negative for congestion and sore throat.   Respiratory: Negative.  Negative for cough and shortness of breath.   Cardiovascular: Negative.  Negative for chest pain and palpitations.  Gastrointestinal:  Negative for abdominal pain, diarrhea, nausea and vomiting.  Genitourinary: Negative.  Negative for dysuria and hematuria.  Skin: Negative.  Negative for rash.  Neurological: Negative.  Negative for dizziness and headaches.  All other systems reviewed and are negative.   Vitals:   11/16/22 1406  BP: 116/80  Pulse: 68  Temp: 98.6 F (37 C)  SpO2: 92%    Physical Exam Vitals reviewed.  Constitutional:      Appearance: Normal  appearance.  HENT:     Head: Normocephalic.     Mouth/Throat:     Mouth: Mucous membranes are moist.     Pharynx: Oropharynx is clear.  Eyes:     Extraocular Movements: Extraocular movements intact.     Pupils: Pupils are equal, round, and reactive to light.  Cardiovascular:     Rate and Rhythm: Normal rate and regular rhythm.     Pulses: Normal pulses.     Heart sounds: Normal heart sounds.  Pulmonary:     Effort: Pulmonary effort is normal.     Breath sounds: Normal breath sounds.  Musculoskeletal:     Cervical back: No tenderness.  Lymphadenopathy:     Cervical: No cervical adenopathy.  Skin:    General: Skin is warm and dry.     Capillary Refill: Capillary refill takes less than 2 seconds.  Neurological:     General: No focal deficit present.     Mental Status: She is alert and oriented to person, place, and time.  Psychiatric:        Mood and Affect: Mood normal.        Behavior: Behavior normal.      ASSESSMENT & PLAN: A total of 43 minutes was spent with the patient and counseling/coordination of care regarding preparing for this visit, review of most recent office visit notes, review of most recent blood work results, review of multiple chronic medical conditions under management, review of all medications, education on nutrition, cardiovascular risks associated with dyslipidemia and prediabetes, review of health maintenance items and need for colon cancer screening, prognosis, documentation, and need for follow-up.  Problem List Items Addressed This Visit       Endocrine   Hypothyroidism - Primary    Clinically euthyroid TSH done today Continue Synthroid 150 mcg daily        Other   Dyslipidemia    Stable.  Diet and nutrition discussed. Lipid profile done today. The 10-year ASCVD risk score (Arnett DK, et al., 2019) is: 2.8%   Values used to calculate the score:     Age: 74 years     Sex: Female     Is Non-Hispanic African American: No     Diabetic:  No     Tobacco smoker: No     Systolic Blood Pressure: 116 mmHg     Is BP treated: No     HDL Cholesterol: 50.6 mg/dL     Total Cholesterol: 237 mg/dL       Prediabetes    Diet and nutrition discussed. Cardiovascular risk associated with diabetes and dyslipidemia discussed Benefits of exercise discussed Advised to decrease amount of daily carbohydrate intake and daily calories and increase amount of plant-based protein in her diet      Patient Instructions  Mantenimiento de la  salud en las [facility], Female Adoptar un estilo de vida saludable y recibir atencin preventiva son importantes para promover la salud y Counsellor. Consulte al mdico sobre: El esquema adecuado para hacerse pruebas y exmenes peridicos. Cosas que puede hacer por su cuenta para prevenir enfermedades y [place] sano. Qu debo saber sobre la dieta, el peso y el ejercicio? Consuma una dieta saludable  Consuma una dieta que incluya muchas verduras, frutas, productos lcteos con bajo contenido de [place] y Associate Professor. No consuma muchos alimentos ricos en grasas slidas, azcares agregados o sodio. Mantenga un peso saludable El ndice de masa muscular [facility]) se [place] para identificar problemas de [place]. Proporciona una estimacin de la grasa corporal basndose en el peso y la altura. Su mdico puede ayudarle a Engineer, site IMC y a Personnel officer o Pharmacologist un peso saludable. Haga ejercicio con regularidad Haga ejercicio con regularidad. Esta es una de las prcticas ms importantes que puede hacer por su salud. La [employer] de los adultos deben seguir estas pautas: Education officer, environmental, al menos, 150 minutos de actividad fsica por semana. El ejercicio debe aumentar la frecuencia cardaca y Media planner transpirar (ejercicio de intensidad moderada). Hacer ejercicios de fortalecimiento por lo [employer] por semana. Agregue esto a su plan de ejercicio de intensidad moderada. Pase menos tiempo sentada. Incluso la  actividad fsica ligera puede ser beneficiosa. Controle sus niveles de colesterol y lpidos en la sangre Comience a realizarse anlisis de lpidos y Oncologist en la sangre a los 20 aos y luego reptalos cada 5 aos. Hgase controlar los niveles de colesterol con mayor frecuencia si: Sus niveles de lpidos y colesterol son altos. Es mayor de 40 aos. Presenta un alto riesgo de padecer enfermedades cardacas. Qu debo saber sobre las pruebas de deteccin del cncer? Segn su historia clnica y sus antecedentes familiares, es posible que deba realizarse pruebas de deteccin del cncer en diferentes edades. Esto puede incluir pruebas de deteccin de lo siguiente: Cncer de mama. Cncer de cuello uterino. Cncer colorrectal. Cncer de piel. Cncer de pulmn. Qu debo saber sobre la enfermedad cardaca, la diabetes y la hipertensin arterial? Presin arterial y enfermedad cardaca La hipertensin arterial causa enfermedades cardacas y [place] el riesgo de accidente cerebrovascular. Es ms probable que esto se manifieste en las personas que tienen lecturas de presin arterial alta o tienen sobrepeso. Hgase controlar la presin arterial: Cada 3 a 5 aos si tiene entre 18 y 32 aos. Todos los aos si es mayor de 40 aos. Diabetes Realcese exmenes de deteccin de la diabetes con regularidad. Este anlisis revisa el nivel de azcar en la sangre en [place]. Hgase las pruebas de deteccin: Cada tres aos despus de los 40 aos de edad si tiene un peso normal y un bajo riesgo de padecer diabetes. Con ms frecuencia y a partir de [place] edad inferior si tiene sobrepeso o un alto riesgo de padecer diabetes. Qu debo saber sobre la prevencin de infecciones? Hepatitis B Si tiene un riesgo ms alto de contraer hepatitis B, debe someterse a un examen de deteccin de este virus. Hable con el mdico para averiguar si tiene riesgo de contraer la infeccin por hepatitis B. Hepatitis C Se recomienda el  anlisis a: [employer] 1945 y 1965. Todas las personas que tengan un riesgo de haber contrado hepatitis C. Enfermedades de transmisin sexual (ETS) Hgase las pruebas de Airline pilot de ITS, incluidas la gonorrea y la clamidia, si: Es sexualmente activa y es menor de [address]. Es  mayor de [address], y Public affairs consultant informa que corre riesgo de tener este tipo de infecciones. La actividad sexual ha cambiado desde que le hicieron la ltima prueba de deteccin y tiene un riesgo mayor de Warehouse manager clamidia o Copy. Pregntele al mdico si usted tiene riesgo. Pregntele al mdico si usted tiene un alto riesgo de Primary school teacher VIH. El mdico tambin puede recomendarle un medicamento recetado para ayudar a evitar la infeccin por el VIH. Si elige tomar medicamentos para prevenir el VIH, primero debe [employer] de deteccin del VIH. Luego debe hacerse anlisis cada 3 meses mientras est tomando los medicamentos. Embarazo Si est por dejar de Armed forces training and education officer (fase premenopusica) y usted puede quedar [place], busque asesoramiento antes de [place]. [place] de 400 a 800 microgramos (mcg) de cido [employer] si [place]. Pida mtodos de control de la natalidad (anticonceptivos) si desea evitar un embarazo no deseado. Osteoporosis y [place] La osteoporosis es una enfermedad en la que los huesos pierden los minerales y la fuerza por el avance de la edad. El resultado pueden ser fracturas en los [place]. Si tiene 65 aos o ms, o si est en riesgo de sufrir osteoporosis y fracturas, pregunte a su mdico si debe: Hacerse pruebas de deteccin de prdida sea. Tomar un suplemento de calcio o de vitamina D para reducir el riesgo de fracturas. Recibir terapia de reemplazo hormonal (TRH) para tratar los sntomas de la menopausia. Siga estas indicaciones en su casa: Consumo de alcohol No beba alcohol si: Su mdico le indica no hacerlo. Est embarazada, puede estar embarazada o  est tratando de [place]. Si bebe alcohol: Limite la cantidad que bebe a lo siguiente: De 0 a 1 bebida por da. Sepa cunta cantidad de alcohol hay en las bebidas que toma. En los [address], una medida equivale a una botella de cerveza de 12 oz (355 ml), un vaso de vino de 5 oz (148 ml) o un vaso de una bebida alcohlica de alta graduacin de 1 oz (44 ml). Estilo de vida No consuma ningn producto que contenga nicotina o tabaco. Estos productos incluyen cigarrillos, tabaco para Theatre manager y aparatos de vapeo, como los Administrator, Civil Service. Si necesita ayuda para dejar de consumir estos productos, consulte al mdico. No consuma drogas. No comparta agujas. Solicite ayuda a su mdico si necesita apoyo o informacin para abandonar las drogas. Indicaciones generales Realcese los estudios de rutina de [address], dentales y de Wellsite geologist. Mantngase al da con las vacunas. Infrmele a su mdico si: Se siente deprimida con frecuencia. Alguna vez ha sido vctima de [place] o no se siente seguro en su casa. Resumen Adoptar un estilo de vida saludable y recibir atencin preventiva son importantes para promover la salud y Counsellor. Siga las instrucciones del mdico acerca de una dieta saludable, el ejercicio y la realizacin de pruebas o exmenes para Hotel manager. Siga las instrucciones del mdico con respecto al control del colesterol y la presin arterial. Esta informacin no tiene Theme park manager el consejo del mdico. Asegrese de hacerle al mdico cualquier pregunta que tenga. Document Revised: 12/12/2020 Document Reviewed: 12/12/2020 Elsevier Patient Education  2023 Elsevier Inc.    Edwina Barth, MD [facility]

## 2022-11-17 ENCOUNTER — Other Ambulatory Visit: Payer: Self-pay | Admitting: Emergency Medicine

## 2022-11-17 DIAGNOSIS — E039 Hypothyroidism, unspecified: Secondary | ICD-10-CM

## 2022-11-17 MED ORDER — LEVOTHYROXINE SODIUM 175 MCG PO TABS
175.0000 ug | ORAL_TABLET | Freq: Every day | ORAL | 3 refills | Status: DC
Start: 2022-11-17 — End: 2023-05-18

## 2022-12-04 ENCOUNTER — Other Ambulatory Visit: Payer: Self-pay | Admitting: Emergency Medicine

## 2022-12-04 DIAGNOSIS — E039 Hypothyroidism, unspecified: Secondary | ICD-10-CM

## 2022-12-12 LAB — COLOGUARD: COLOGUARD: NEGATIVE

## 2023-01-20 ENCOUNTER — Other Ambulatory Visit (HOSPITAL_COMMUNITY)
Admission: RE | Admit: 2023-01-20 | Discharge: 2023-01-20 | Disposition: A | Discharge status: Home / Self Care | Payer: 59 | Source: Ambulatory Visit | Attending: Obstetrics & Gynecology | Admitting: Obstetrics & Gynecology

## 2023-01-20 ENCOUNTER — Encounter: Payer: Self-pay | Admitting: Obstetrics & Gynecology

## 2023-01-20 ENCOUNTER — Ambulatory Visit (INDEPENDENT_AMBULATORY_CARE_PROVIDER_SITE_OTHER): Payer: 59 | Admitting: Obstetrics & Gynecology

## 2023-01-20 VITALS — BP 108/68 | HR 65 | Temp 98.5°F

## 2023-01-20 DIAGNOSIS — Z780 Asymptomatic menopausal state: Secondary | ICD-10-CM

## 2023-01-20 DIAGNOSIS — R309 Painful micturition, unspecified: Secondary | ICD-10-CM | POA: Diagnosis not present

## 2023-01-20 DIAGNOSIS — Z01419 Encounter for gynecological examination (general) (routine) without abnormal findings: Secondary | ICD-10-CM | POA: Diagnosis not present

## 2023-01-20 LAB — URINALYSIS, COMPLETE W/RFL CULTURE
Glucose, UA: NEGATIVE
Hyaline Cast: NONE SEEN /LPF
Nitrites, Initial: NEGATIVE
pH: 6 (ref 5.0–8.0)

## 2023-01-20 LAB — CULTURE INDICATED

## 2023-01-20 NOTE — Progress Notes (Signed)
Shaun Rubel 11/23/1965 [id number]   History:    57 y.o. G3P3L3 Married  RP:  Established patient presenting for annual gyn exam   HPI: Postmenopausal, well on no HRT, no bleeding. Normal pap and mammogram history. Last Pap Neg 12/2019.  Pap reflex today.  Breasts normal.  Mammo Neg 05/2022. Had pain with urination last week, resolved.  Hypothyroidism managed by PCP.  Health labs with Fam MD.  Cologuard Neg in 2024. Recommend Colonoscopy.    Past medical history,surgical history, family history and social history were all reviewed and documented in the EPIC chart.  Gynecologic History No LMP recorded. Patient is postmenopausal.  Obstetric History OB History  Gravida Para Term Preterm AB Living  3 3 3    0 3  SAB IAB Ectopic Multiple Live Births  0            # Outcome Date GA Lbr Len/2nd Weight Sex Delivery Anes PTL Lv  3 Term           2 Term           1 Term              ROS: A ROS was performed and pertinent positives and negatives are included in the history. GENERAL: No fevers or chills. HEENT: No change in vision, no earache, sore throat or sinus congestion. NECK: No pain or stiffness. CARDIOVASCULAR: No chest pain or pressure. No palpitations. PULMONARY: No shortness of breath, cough or wheeze. GASTROINTESTINAL: No abdominal pain, nausea, vomiting or diarrhea, melena or bright red blood per rectum. GENITOURINARY: No urinary frequency, urgency, hesitancy or dysuria. MUSCULOSKELETAL: No joint or muscle pain, no back pain, no recent trauma. DERMATOLOGIC: No rash, no itching, no lesions. ENDOCRINE: No polyuria, polydipsia, no heat or cold intolerance. No recent change in weight. HEMATOLOGICAL: No anemia or easy bruising or bleeding. NEUROLOGIC: No headache, seizures, numbness, tingling or weakness. PSYCHIATRIC: No depression, no loss of interest in normal activity or change in sleep pattern.     Exam:   BP 108/68   Pulse 65   Temp 98.5 F (36.9 C) (Oral)   SpO2 98%    There is no height or weight on file to calculate BMI.  General appearance : Well developed well nourished female. No acute distress HEENT: Eyes: no retinal hemorrhage or exudates,  Neck supple, trachea midline, no carotid bruits, no thyroidmegaly Lungs: Clear to auscultation, no rhonchi or wheezes, or rib retractions  Heart: Regular rate and rhythm, no murmurs or gallops Breast:Examined in sitting and supine position were symmetrical in appearance, no palpable masses or tenderness,  no skin retraction, no nipple inversion, no nipple discharge, no skin discoloration, no axillary or supraclavicular lymphadenopathy Abdomen: no palpable masses or tenderness, no rebound or guarding Extremities: no edema or skin discoloration or tenderness  Pelvic: Vulva: Normal             Vagina: No gross lesions or discharge  Cervix: Pap reflex done. Small cervical polyp, attempted excision, but not successful so cauterized it with Silver Nitrate. No discharge.  Uterus  AV, normal size, shape and consistency, non-tender and mobile  Adnexa  Without masses or tenderness  Anus: Normal  U/A: Yellow clear, Pro Neg, Nit Neg, WBC 6-10, RBC 3-10, Bacteria Few.  U. Culture pending.   Assessment/Plan:  57 y.o. female for annual exam   1. Encounter for routine gynecological examination with Papanicolaou smear of cervix Postmenopausal, well on no HRT, no bleeding. Normal pap and mammogram history.  Last Pap Neg 12/2019.  Pap reflex today.  Breasts normal.  Mammo Neg 05/2022. Had pain with urination last week, resolved.  Hypothyroidism managed by PCP.  Health labs with Fam MD.  Cologuard Neg in 2024. Recommend Colonoscopy.   - Cytology - PAP( [place])  2. Postmenopause Postmenopausal, well on no HRT, no bleeding.   3. Pain with urination Minimally perturbed U/A, will wait on U. Culture. - Urinalysis,Complete w/RFL Culture   Genia Del MD, 2:21 PM

## 2023-01-22 LAB — URINALYSIS, COMPLETE W/RFL CULTURE
Bilirubin Urine: NEGATIVE
Protein, ur: NEGATIVE
Specific Gravity, Urine: 1.025 (ref 1.001–1.035)

## 2023-01-22 LAB — URINE CULTURE
MICRO NUMBER:: 15158728
Result:: NO GROWTH
SPECIMEN QUALITY:: ADEQUATE

## 2023-01-25 LAB — CYTOLOGY - PAP: Diagnosis: NEGATIVE

## 2023-05-17 ENCOUNTER — Ambulatory Visit (INDEPENDENT_AMBULATORY_CARE_PROVIDER_SITE_OTHER): Payer: 59 | Admitting: Emergency Medicine

## 2023-05-17 ENCOUNTER — Encounter: Payer: Self-pay | Admitting: Emergency Medicine

## 2023-05-17 VITALS — BP 124/68 | HR 58 | Temp 98.1°F | Ht 64.0 in | Wt 185.0 lb

## 2023-05-17 DIAGNOSIS — Z23 Encounter for immunization: Secondary | ICD-10-CM

## 2023-05-17 DIAGNOSIS — E785 Hyperlipidemia, unspecified: Secondary | ICD-10-CM | POA: Diagnosis not present

## 2023-05-17 DIAGNOSIS — R7303 Prediabetes: Secondary | ICD-10-CM

## 2023-05-17 DIAGNOSIS — E039 Hypothyroidism, unspecified: Secondary | ICD-10-CM | POA: Diagnosis not present

## 2023-05-17 LAB — COMPREHENSIVE METABOLIC PANEL
ALT: 24 U/L (ref 0–35)
AST: 23 U/L (ref 0–37)
Albumin: 4.4 g/dL (ref 3.5–5.2)
Alkaline Phosphatase: 63 U/L (ref 39–117)
BUN: 9 mg/dL (ref 6–23)
CO2: 28 meq/L (ref 19–32)
Calcium: 9.4 mg/dL (ref 8.4–10.5)
Chloride: 103 meq/L (ref 96–112)
Creatinine, Ser: 0.71 mg/dL (ref 0.40–1.20)
GFR: 93.61 mL/min (ref >60.00)
Glucose, Bld: 102 mg/dL — ABNORMAL HIGH (ref 70–99)
Potassium: 3.8 meq/L (ref 3.5–5.1)
Sodium: 138 meq/L (ref 135–145)
Total Bilirubin: 0.4 mg/dL (ref 0.2–1.2)
Total Protein: 7.4 g/dL (ref 6.0–8.3)

## 2023-05-17 LAB — LIPID PANEL
Cholesterol: 224 mg/dL — ABNORMAL HIGH (ref 0–200)
HDL: 49.1 mg/dL (ref >39.00)
LDL Cholesterol: 133 mg/dL — ABNORMAL HIGH (ref 0–99)
NonHDL: 174.44
Total CHOL/HDL Ratio: 5
Triglycerides: 205 mg/dL — ABNORMAL HIGH (ref 0.0–149.0)
VLDL: 41 mg/dL — ABNORMAL HIGH (ref 0.0–40.0)

## 2023-05-17 LAB — CBC WITH DIFFERENTIAL/PLATELET
Basophils Absolute: 0 10*3/uL (ref 0.0–0.1)
Basophils Relative: 0.4 % (ref 0.0–3.0)
Eosinophils Absolute: 0.1 10*3/uL (ref 0.0–0.7)
Eosinophils Relative: 1.6 % (ref 0.0–5.0)
HCT: 40.1 % (ref 36.0–46.0)
Hemoglobin: 13.2 g/dL (ref 12.0–15.0)
Lymphocytes Relative: 43.6 % (ref 12.0–46.0)
Lymphs Abs: 3.8 10*3/uL (ref 0.7–4.0)
MCHC: 33.1 g/dL (ref 30.0–36.0)
MCV: 96.2 fL (ref 78.0–100.0)
Monocytes Absolute: 0.5 10*3/uL (ref 0.1–1.0)
Monocytes Relative: 5.6 % (ref 3.0–12.0)
Neutro Abs: 4.3 10*3/uL (ref 1.4–7.7)
Neutrophils Relative %: 48.8 % (ref 43.0–77.0)
Platelets: 309 10*3/uL (ref 150.0–400.0)
RBC: 4.17 Mil/uL (ref 3.87–5.11)
RDW: 13.3 % (ref 11.5–15.5)
WBC: 8.8 10*3/uL (ref 4.0–10.5)

## 2023-05-17 LAB — HEMOGLOBIN A1C: Hgb A1c MFr Bld: 6.1 % (ref 4.6–6.5)

## 2023-05-17 LAB — TSH: TSH: 9.51 u[IU]/mL — ABNORMAL HIGH (ref 0.35–5.50)

## 2023-05-17 NOTE — Assessment & Plan Note (Signed)
Diet and nutrition discussed Cardiovascular risks associated with diabetes discussed Diet and nutrition discussed Advised to decrease amount of daily carbohydrate intake and daily calories and increase amount of plant-based protein in her diet

## 2023-05-17 NOTE — Assessment & Plan Note (Signed)
Clinically euthyroid TSH done today Continue levothyroxine 175 mcg daily Will adjust dose accordingly

## 2023-05-17 NOTE — Assessment & Plan Note (Signed)
Chronic stable condition Diet and nutrition discussed Continue atorvastatin 40 mg daily Lipid profile done today

## 2023-05-17 NOTE — Progress Notes (Signed)
Tracey Campbell 58 y.o.   Chief Complaint  Patient presents with   Medical Management of Chronic Issues    f/u appt, no concerns     HISTORY OF PRESENT ILLNESS: This is a 57 y.o. female A1A here for 76-month follow-up of chronic medical conditions including hypothyroidism and dyslipidemia Overall doing well.  Has no complaints or medical concerns today.  HPI   Prior to Admission medications   Medication Sig Start Date End Date Taking? Authorizing Provider  atorvastatin (LIPITOR) 40 MG tablet TAKE 1 TABLET DAILY 10/05/22  Yes Ileen Kahre, Eilleen Kempf, MD  levothyroxine (SYNTHROID) 175 MCG tablet Take 1 tablet (175 mcg total) by mouth daily. 11/17/22  Yes Cassie Henkels, Eilleen Kempf, MD  Omega-3 Fatty Acids (FISH OIL) 1000 MG CPDR Take by mouth daily.   Yes [provider]    No Known Allergies  Patient Active Problem List   Diagnosis Date Noted   Prediabetes 05/14/2022   Dyslipidemia 05/15/2021   History of high cholesterol 09/28/2018   Hypothyroidism 09/28/2018    Past Medical History:  Diagnosis Date   Elevated cholesterol    Thyroid disease     Past Surgical History:  Procedure Laterality Date   CARDIAC VALVE REPLACEMENT      Social History   Socioeconomic History   Marital status: Married    Spouse name: Not on file   Number of children: Not on file   Years of education: Not on file   Highest education level: 2nd grade  Occupational History   Not on file  Tobacco Use   Smoking status: Never   Smokeless tobacco: Never  Vaping Use   Vaping status: Never Used  Substance and Sexual Activity   Alcohol use: Never   Drug use: Never   Sexual activity: Not Currently    Partners: Male    Birth control/protection: Post-menopausal  Other Topics Concern   Not on file  Social History Narrative   Not on file   Social Determinants of Health   Financial Resource Strain: Low Risk  (05/13/2023)   Overall Financial Resource Strain (CARDIA)    Difficulty of  Paying Living Expenses: Not very hard  Food Insecurity: No Food Insecurity (05/13/2023)   Hunger Vital Sign    Worried About Running Out of Food in the Last Year: Never true    Ran Out of Food in the Last Year: Never true  Transportation Needs: No Transportation Needs (05/13/2023)   PRAPARE - Administrator, Civil Service (Medical): No    Lack of Transportation (Non-Medical): No  Physical Activity: Insufficiently Active (05/13/2023)   Exercise Vital Sign    Days of Exercise per Week: 2 days    Minutes of Exercise per Session: 20 min  Stress: No Stress Concern Present (05/13/2023)   [employer] of Occupational Health - Occupational Stress Questionnaire    Feeling of Stress : Not at all  Social Connections: Moderately Integrated (05/13/2023)   Social Connection and Isolation Panel [NHANES]    Frequency of Communication with Friends and Family: Once a week    Frequency of Social Gatherings with Friends and Family: Once a week    Attends Religious Services: More than 4 times per year    Active Member of [employer] or Organizations: Yes    Attends Engineer, structural: More than 4 times per year    Marital Status: Married  Catering manager Violence: Not on file    Family History  Problem Relation Age of Onset  Healthy Sister      Review of Systems  Constitutional: Negative.  Negative for chills and fever.  HENT: Negative.  Negative for congestion and sore throat.   Respiratory: Negative.  Negative for cough and shortness of breath.   Cardiovascular: Negative.  Negative for chest pain and palpitations.  Gastrointestinal: Negative.  Negative for abdominal pain, diarrhea, nausea and vomiting.  Genitourinary: Negative.  Negative for dysuria and hematuria.  Skin: Negative.  Negative for rash.  Neurological: Negative.  Negative for dizziness and headaches.  All other systems reviewed and are negative.   Vitals:   05/17/23 1356  BP: 124/68  Pulse: (!) 58   Temp: 98.1 F (36.7 C)  SpO2: 99%    Physical Exam Vitals reviewed.  Constitutional:      Appearance: Normal appearance.  HENT:     Head: Normocephalic.     Mouth/Throat:     Mouth: Mucous membranes are moist.     Pharynx: Oropharynx is clear.  Eyes:     Extraocular Movements: Extraocular movements intact.     Conjunctiva/sclera: Conjunctivae normal.     Pupils: Pupils are equal, round, and reactive to light.  Cardiovascular:     Rate and Rhythm: Normal rate and regular rhythm.     Pulses: Normal pulses.     Heart sounds: Normal heart sounds.  Pulmonary:     Effort: Pulmonary effort is normal.     Breath sounds: Normal breath sounds.  Musculoskeletal:     Cervical back: No tenderness.     Right lower leg: No edema.     Left lower leg: No edema.  Lymphadenopathy:     Cervical: No cervical adenopathy.  Skin:    General: Skin is warm and dry.  Neurological:     General: No focal deficit present.     Mental Status: She is alert and oriented to person, place, and time.  Psychiatric:        Mood and Affect: Mood normal.        Behavior: Behavior normal.      ASSESSMENT & PLAN: A total of 45 minutes was spent with the patient and counseling/coordination of care regarding preparing for this visit, review of most recent office visit notes, review of multiple chronic medical conditions under management, review of all medications, review of most recent blood work results, cardiovascular risks associated with dyslipidemia and prediabetes, education on nutrition, prognosis, documentation, and need for follow-up.  Problem List Items Addressed This Visit       Endocrine   Hypothyroidism - Primary    Clinically euthyroid TSH done today Continue levothyroxine 175 mcg daily Will adjust dose accordingly      Relevant Orders   TSH   CBC with Differential/Platelet   Comprehensive metabolic panel     Other   Dyslipidemia    Chronic stable condition Diet and nutrition  discussed Continue atorvastatin 40 mg daily Lipid profile done today      Relevant Orders   CBC with Differential/Platelet   Comprehensive metabolic panel   Lipid panel   Prediabetes    Diet and nutrition discussed Cardiovascular risks associated with diabetes discussed Diet and nutrition discussed Advised to decrease amount of daily carbohydrate intake and daily calories and increase amount of plant-based protein in her diet      Relevant Orders   CBC with Differential/Platelet   Comprehensive metabolic panel   Hemoglobin A1c   Other Visit Diagnoses     Need for vaccination  Relevant Orders   Flu vaccine trivalent PF, 6mos and older(Flulaval,Afluria,Fluarix,Fluzone)      Patient Instructions  Mantenimiento de la salud en las mujeres Health Maintenance, Female Adoptar un estilo de vida saludable y recibir atencin preventiva son importantes para promover la salud y Counsellor. Consulte al mdico sobre: El esquema adecuado para hacerse pruebas y exmenes peridicos. Cosas que puede hacer por su cuenta para prevenir enfermedades y [place] sano. Qu debo saber sobre la dieta, el peso y el ejercicio? Consuma una dieta saludable  Consuma una dieta que incluya muchas verduras, frutas, productos lcteos con bajo contenido de [place] y Associate Professor. No consuma muchos alimentos ricos en grasas slidas, azcares agregados o sodio. Mantenga un peso saludable El ndice de masa muscular [facility]) se [place] para identificar problemas de [place]. Proporciona una estimacin de la grasa corporal basndose en el peso y la altura. Su mdico puede ayudarle a Engineer, site [facility] y a Personnel officer o Pharmacologist un peso saludable. Haga ejercicio con regularidad Haga ejercicio con regularidad. Esta es una de las prcticas ms importantes que puede hacer por su salud. La [employer] de los adultos deben seguir estas pautas: Education officer, environmental, al menos, 150 minutos de actividad fsica por semana. El ejercicio debe  aumentar la frecuencia cardaca y Media planner transpirar (ejercicio de intensidad moderada). Hacer ejercicios de fortalecimiento por lo [employer] por semana. Agregue esto a su plan de ejercicio de intensidad moderada. Pase menos tiempo sentada. Incluso la actividad fsica ligera puede ser beneficiosa. Controle sus niveles de colesterol y lpidos en la sangre Comience a realizarse anlisis de lpidos y Oncologist en la sangre a los 20 aos y luego reptalos cada 5 aos. Hgase controlar los niveles de colesterol con mayor frecuencia si: Sus niveles de lpidos y colesterol son altos. Es mayor de 40 aos. Presenta un alto riesgo de padecer enfermedades cardacas. Qu debo saber sobre las pruebas de deteccin del cncer? Segn su historia clnica y sus antecedentes familiares, es posible que deba realizarse pruebas de deteccin del cncer en diferentes edades. Esto puede incluir pruebas de deteccin de lo siguiente: Cncer de mama. Cncer de cuello uterino. Cncer colorrectal. Cncer de piel. Cncer de pulmn. Qu debo saber sobre la enfermedad cardaca, la diabetes y la hipertensin arterial? Presin arterial y enfermedad cardaca La hipertensin arterial causa enfermedades cardacas y [place] el riesgo de accidente cerebrovascular. Es ms probable que esto se manifieste en las personas que tienen lecturas de presin arterial alta o tienen sobrepeso. Hgase controlar la presin arterial: Cada 3 a 5 aos si tiene entre 18 y 61 aos. Todos los aos si es mayor de 40 aos. Diabetes Realcese exmenes de deteccin de la diabetes con regularidad. Este anlisis revisa el nivel de azcar en la sangre en [place]. Hgase las pruebas de deteccin: Cada tres aos despus de los 40 aos de edad si tiene un peso normal y un bajo riesgo de padecer diabetes. Con ms frecuencia y a partir de [employer] edad inferior si tiene sobrepeso o un alto riesgo de padecer diabetes. Qu debo saber sobre la prevencin de  infecciones? Hepatitis B Si tiene un riesgo ms alto de contraer hepatitis B, debe someterse a un examen de deteccin de este virus. Hable con el mdico para averiguar si tiene riesgo de contraer la infeccin por hepatitis B. Hepatitis C Se recomienda el anlisis a: [employer] 1945 y 1965. Todas las personas que tengan un riesgo de haber contrado hepatitis C. Enfermedades de transmisin sexual (ETS) Hgase las  pruebas de deteccin de ITS, incluidas la gonorrea y la clamidia, si: Es sexualmente activa y es menor de [address]. Es mayor de [address], y Public affairs consultant informa que corre riesgo de tener este tipo de infecciones. La actividad sexual ha cambiado desde que le hicieron la ltima prueba de deteccin y tiene un riesgo mayor de Warehouse manager clamidia o Copy. Pregntele al mdico si usted tiene riesgo. Pregntele al mdico si usted tiene un alto riesgo de Primary school teacher VIH. El mdico tambin puede recomendarle un medicamento recetado para ayudar a evitar la infeccin por el VIH. Si elige tomar medicamentos para prevenir el VIH, primero debe [employer] de deteccin del VIH. Luego debe hacerse anlisis cada 3 meses mientras est tomando los medicamentos. Embarazo Si est por dejar de Armed forces training and education officer (fase premenopusica) y usted puede quedar [place], busque asesoramiento antes de [place]. Tome de 400 a 800 microgramos (mcg) de cido [employer] si [place]. Pida mtodos de control de la natalidad (anticonceptivos) si desea evitar un embarazo no deseado. Osteoporosis y [place] La osteoporosis es una enfermedad en la que los huesos pierden los minerales y la fuerza por el avance de la edad. El resultado pueden ser fracturas en los [place]. Si tiene 65 aos o ms, o si est en riesgo de sufrir osteoporosis y fracturas, pregunte a su mdico si debe: Hacerse pruebas de deteccin de prdida sea. Tomar un suplemento de calcio o de vitamina D para reducir el  riesgo de fracturas. Recibir terapia de reemplazo hormonal (TRH) para tratar los sntomas de la menopausia. Siga estas indicaciones en su casa: Consumo de alcohol No beba alcohol si: Su mdico le indica no hacerlo. Est embarazada, puede estar embarazada o est tratando de [place]. Si bebe alcohol: Limite la cantidad que bebe a lo siguiente: De 0 a 1 bebida por da. Sepa cunta cantidad de alcohol hay en las bebidas que toma. En los [address], una medida equivale a una botella de cerveza de 12 oz (355 ml), un vaso de vino de 5 oz (148 ml) o un vaso de una bebida alcohlica de alta graduacin de 1 oz (44 ml). Estilo de vida No consuma ningn producto que contenga nicotina o tabaco. Estos productos incluyen cigarrillos, tabaco para Theatre manager y aparatos de vapeo, como los Administrator, Civil Service. Si necesita ayuda para dejar de consumir estos productos, consulte al mdico. No consuma drogas. No comparta agujas. Solicite ayuda a su mdico si necesita apoyo o informacin para abandonar las drogas. Indicaciones generales Realcese los estudios de rutina de [address], dentales y de Wellsite geologist. Mantngase al da con las vacunas. Infrmele a su mdico si: Se siente deprimida con frecuencia. Alguna vez ha sido vctima de [place] o no se siente seguro en su casa. Resumen Adoptar un estilo de vida saludable y recibir atencin preventiva son importantes para promover la salud y Counsellor. Siga las instrucciones del mdico acerca de una dieta saludable, el ejercicio y la realizacin de pruebas o exmenes para Hotel manager. Siga las instrucciones del mdico con respecto al control del colesterol y la presin arterial. Esta informacin no tiene Theme park manager el consejo del mdico. Asegrese de hacerle al mdico cualquier pregunta que tenga. Document Revised: 12/12/2020 Document Reviewed: 12/12/2020 Elsevier Patient Education  2024 Elsevier Inc.      Edwina Barth,  MD [facility]

## 2023-05-17 NOTE — Patient Instructions (Signed)

## 2023-05-18 ENCOUNTER — Other Ambulatory Visit: Payer: Self-pay | Admitting: Emergency Medicine

## 2023-05-18 DIAGNOSIS — E039 Hypothyroidism, unspecified: Secondary | ICD-10-CM

## 2023-05-18 MED ORDER — LEVOTHYROXINE SODIUM 200 MCG PO TABS: 200.0000 ug | ORAL_TABLET | Freq: Every day | ORAL | 3 refills | Status: DC

## 2023-05-25 ENCOUNTER — Telehealth: Payer: Self-pay | Admitting: *Deleted

## 2023-05-25 NOTE — Telephone Encounter (Signed)
Called patient and left her a message stating her form is ready for pick up. Patient needs to get measurements for her waist and hips. If she comes to the office please let the CMA know.

## 2023-07-29 ENCOUNTER — Ambulatory Visit: Payer: BC Managed Care – PPO | Admitting: Emergency Medicine

## 2023-07-29 ENCOUNTER — Encounter: Payer: Self-pay | Admitting: Emergency Medicine

## 2023-07-29 VITALS — BP 108/72 | HR 66 | Temp 98.3°F | Ht 64.0 in | Wt 182.2 lb

## 2023-07-29 DIAGNOSIS — F4541 Pain disorder exclusively related to psychological factors: Secondary | ICD-10-CM | POA: Diagnosis not present

## 2023-07-29 MED ORDER — HYDROCODONE-ACETAMINOPHEN 5-325 MG PO TABS: 1.0000 | ORAL_TABLET | Freq: Four times a day (QID) | ORAL | 0 refills | Status: AC | PRN

## 2023-07-29 NOTE — Patient Instructions (Signed)
 Cefalea tensional en los adultos Tension Headache, Adult La cefalea tensional es una sensacin de dolor o presin en la cabeza. Suele sentirse sobre la frente y a los lados de la cabeza. Las cefaleas tensionales pueden durar de 30 minutos a [address]. Cules son las causas? Se desconoce la causa de esta afeccin. A veces, las cefaleas tensionales se producen por el estrs, la preocupacin (ansiedad) o la depresin. Otras cosas que pueden ser causarlas son las siguientes: Alcohol. Exceso de cafena o abstinencia de cafena. Resfros, gripe o infecciones de los senos paranasales. Problemas dentales. Estos pueden arts administrator. Cansancio. Mantener la cabeza y el cuello en la misma posicin durante un perodo prolongado, por ejemplo, al usar la computadora. Fumar. Artritis en el cuello. Cules son los signos o los sntomas? Sensacin de presin alrededor de la cabeza. Un dolor sordo en la cabeza. Dolor sobre la frente y los lados de la cabeza. Dolor a la palpacin en los msculos de la cabeza, del cuello y de los hombros. Cmo se trata? Esta afeccin puede tratarse con cambios en el estilo de vida y medicamentos que ayudan a paramedic los sntomas. Siga estas instrucciones en su casa: Control del dolor Use los medicamentos de venta libre y los recetados solamente como se lo haya indicado el mdico. Cuando tenga una cefalea, acustese en una habitacin oscura y silenciosa. Si se lo indican, pngase hielo en la cabeza y el cuello. Para hacer esto: Ponga el hielo en una bolsa plstica. Coloque una toalla entre la piel y la bolsa. Aplique el hielo durante 20 minutos, 2 o 3 veces por da. Retire el hielo si la piel se le pone de color rojo brillante. Esto es intel. Si no puede sentir dolor, calor o fro, tiene un mayor riesgo de que se dae la zona. Si se lo indican, aplquese calor en la nuca. Hgalo con la frecuencia que le haya indicado el mdico. Use la fuente de  calor que el mdico le recomiende, como una compresa de calor hmedo o una almohadilla trmica. Coloque una toalla entre la piel y la fuente de calor. Aplique calor durante 20 a 30 minutos. Retire la fuente de calor si la piel se le pone de color rojo brillante. Esto es intel. Si no puede sentir dolor, calor o fro, tiene un mayor riesgo de [place]. Comida y bebida Mantenga un horario para las comidas. Si bebe alcohol: Limite la cantidad que bebe a lo siguiente: De 0 a 1 medida por da para las mujeres que no estn embarazadas. De 0 a 2 medidas por da para los hombres. Sepa cunta cantidad de alcohol hay en las bebidas que toma. En los [address], una medida equivale a una botella de cerveza de 12 oz (355 ml), un vaso de vino de 5 oz (148 ml) o un vaso de una bebida alcohlica de alta graduacin de 1 oz (44 ml). Beba suficiente lquido para radio producer pis (orina) de color amarillo plido. No consuma mucha cafena ni deje de consumirla por completo. Estilo de vida [place]a entre 7 y 9 horas todas las noches. O duerma la cantidad de sempra energy indique el mdico. A la hora de [place], [place] las computadoras, los telfonos y las tabletas fuera del dormitorio. Encuentre maneras de museum/gallery exhibitions officer. Esto puede incluir: Realizar actividad fsica. Respiracin profunda. Yoga. Escuchar msica. Tener pensamientos positivos. Sintese con la espalda recta. Intentar relajar los msculos. No fume ni consuma ningn producto que contenga nicotina o  tabaco. Si necesita ayuda para dejar de consumir estos productos, consulte al mdico. Instrucciones generales  Evite las cosas que puedan provocar las cefaleas. Lleve un diario sobre las cefaleas para ver qu puede provocarlas. Registre, por ejemplo, lo siguiente: Lo que usted come y bebe. El tiempo que duerme. Algn cambio en su dieta o en los medicamentos. Cumpla con todas las visitas de seguimiento. Comunquese con un mdico  si: La cefalea no se alivia. La cefalea regresa. Tiene una cefalea y citigroup sonidos, la luz o los olores. Tiene ganas de vomitar o vomita. Le duele el estmago. Solicite ayuda de inmediato si: De repente siente un dolor de cabeza muy intenso junto con cualquiera de estas cosas: Rigidez en el cuello. Ganas de vomitar. Vmitos. Sentirse confundido (confuso). Sensacin de debilidad en una parte o un lado del cuerpo. Problemas para ver o hablar, o ambas cosas. Falta de aire. Erupcin cutnea. Sentirse muy somnoliento. Dolor en el ojo o el odo. Dificultad para caminar o mantener el equilibrio. Sensacin de que va a [place], o se desmaya. Resumen La cefalea tensional es un dolor o una presin que se siente en la cabeza. Las cefaleas tensionales pueden durar de 30 minutos a [address]. Los [place] incorporated en el estilo de vida y los medicamentos pueden ayudar a engineer, materials. Esta informacin no tiene theme park manager el consejo del mdico. Asegrese de hacerle al mdico cualquier pregunta que tenga. Document Revised: 05/14/2020 Document Reviewed: 05/14/2020 Elsevier Patient Education  2024 [employer].

## 2023-07-29 NOTE — Assessment & Plan Note (Signed)
 Clinically stable.  Benign presentation. No red flag signs or symptoms. Over-the-counter medications not helping Recommend Tylenol  for mild to moderate headache and Norco for moderate to severe headache Known trigger.  Stress management discussed. Advised to contact the office if no better or worse during the next several days.

## 2023-07-29 NOTE — Progress Notes (Signed)
 Tracey Campbell 58 y.o.   Chief Complaint  Patient presents with   Headache    Has been happening for 2 weeks, she feels pressure at the back of her head and by her ears. she's been taking tylenol  for and it has not help. No blurred vision, no headaches      HISTORY OF PRESENT ILLNESS: This is a 58 y.o. female complaining of headache starting in the back of her neck for the last 2 weeks. Mostly at nighttime after returning from work.  Not associated with any other symptoms. Denies dizziness or visual problems.  Denies balance problems.  No flulike symptoms.  Denies fever or chills. Work doing well.  She works at a [employer]. Personal problems at home increasing stress levels.  Headache  Pertinent negatives include no abdominal pain, coughing, fever, nausea, sore throat or vomiting.     Prior to Admission medications   Medication Sig Start Date End Date Taking? Authorizing Provider  HYDROcodone -acetaminophen  (NORCO) 5-325 MG tablet Take 1 tablet by mouth every 6 (six) hours as needed for moderate pain (pain score 4-6). 07/29/23  Yes Purcell Emil Schanz, MD  atorvastatin  (LIPITOR) 40 MG tablet TAKE 1 TABLET DAILY 10/05/22   Purcell Emil Schanz, MD  levothyroxine  (SYNTHROID ) 200 MCG tablet Take 1 tablet (200 mcg total) by mouth daily. 05/18/23   Purcell Emil Schanz, MD  Omega-3 Fatty Acids (FISH OIL) 1000 MG CPDR Take by mouth daily.    [provider]    No Known Allergies  Patient Active Problem List   Diagnosis Date Noted   Prediabetes 05/14/2022   Dyslipidemia 05/15/2021   History of high cholesterol 09/28/2018   Hypothyroidism 09/28/2018    Past Medical History:  Diagnosis Date   Elevated cholesterol    Thyroid  disease     Past Surgical History:  Procedure Laterality Date   CARDIAC VALVE REPLACEMENT      Social History   Socioeconomic History   Marital status: Married    Spouse name: Not on file   Number of children: Not on file   Years of  education: Not on file   Highest education level: 2nd grade  Occupational History   Not on file  Tobacco Use   Smoking status: Never   Smokeless tobacco: Never  Vaping Use   Vaping status: Never Used  Substance and Sexual Activity   Alcohol use: Never   Drug use: Never   Sexual activity: Not Currently    Partners: Male    Birth control/protection: Post-menopausal  Other Topics Concern   Not on file  Social History Narrative   Not on file   Social Drivers of Health   Financial Resource Strain: Low Risk  (05/13/2023)   Overall Financial Resource Strain (CARDIA)    Difficulty of Paying Living Expenses: Not very hard  Food Insecurity: No Food Insecurity (05/13/2023)   Hunger Vital Sign    Worried About Running Out of Food in the Last Year: Never true    Ran Out of Food in the Last Year: Never true  Transportation Needs: No Transportation Needs (05/13/2023)   PRAPARE - Administrator, Civil Service (Medical): No    Lack of Transportation (Non-Medical): No  Physical Activity: Insufficiently Active (05/13/2023)   Exercise Vital Sign    Days of Exercise per Week: 2 days    Minutes of Exercise per Session: 20 min  Stress: No Stress Concern Present (05/13/2023)   [employer] of Occupational Health - Occupational Stress Questionnaire  Feeling of Stress : Not at all  Social Connections: Moderately Integrated (05/13/2023)   Social Connection and Isolation Panel [NHANES]    Frequency of Communication with Friends and Family: Once a week    Frequency of Social Gatherings with Friends and Family: Once a week    Attends Religious Services: More than 4 times per year    Active Member of [employer] or Organizations: Yes    Attends Engineer, Structural: More than 4 times per year    Marital Status: Married  Catering Manager Violence: Not on file    Family History  Problem Relation Age of Onset   Healthy Sister      Review of Systems  Constitutional:  Negative.  Negative for chills and fever.  HENT: Negative.  Negative for congestion and sore throat.   Respiratory: Negative.  Negative for cough and shortness of breath.   Cardiovascular: Negative.  Negative for chest pain and palpitations.  Gastrointestinal:  Negative for abdominal pain, diarrhea, nausea and vomiting.  Genitourinary: Negative.  Negative for dysuria and hematuria.  Skin: Negative.  Negative for rash.  Neurological:  Positive for headaches.  All other systems reviewed and are negative.   Vitals:   07/29/23 1546  BP: 108/72  Pulse: 66  Temp: 98.3 F (36.8 C)  SpO2: 95%    Physical Exam Vitals reviewed.  Constitutional:      Appearance: She is well-developed.  HENT:     Head: Normocephalic.     Mouth/Throat:     Mouth: Mucous membranes are moist.     Pharynx: Oropharynx is clear.  Eyes:     Extraocular Movements: Extraocular movements intact.     Conjunctiva/sclera: Conjunctivae normal.     Pupils: Pupils are equal, round, and reactive to light.  Cardiovascular:     Rate and Rhythm: Normal rate and regular rhythm.     Pulses: Normal pulses.     Heart sounds: Normal heart sounds.  Pulmonary:     Effort: Pulmonary effort is normal.     Breath sounds: Normal breath sounds.  Abdominal:     Palpations: Abdomen is soft.     Tenderness: There is no abdominal tenderness.  Musculoskeletal:     Cervical back: No tenderness.  Lymphadenopathy:     Cervical: No cervical adenopathy.  Skin:    General: Skin is warm and dry.  Neurological:     General: No focal deficit present.     Mental Status: She is alert and oriented to person, place, and time.     Sensory: No sensory deficit.     Motor: No weakness.     Coordination: Coordination normal.     Gait: Gait normal.  Psychiatric:        Mood and Affect: Mood normal.        Behavior: Behavior normal.      ASSESSMENT & PLAN: A total of 34 minutes was spent with the patient and counseling/coordination of  care regarding preparing for this visit, review of most recent office visit notes, differential diagnosis of headaches, headache management, review of all medications, prognosis, documentation, and need for follow-up if no better or worse during the next several days.  Problem List Items Addressed This Visit       Other   Stress headache - Primary   Clinically stable.  Benign presentation. No red flag signs or symptoms. Over-the-counter medications not helping Recommend Tylenol  for mild to moderate headache and Norco for moderate to severe headache Known trigger.  Stress management discussed. Advised to contact the office if no better or worse during the next several days.      Relevant Medications   HYDROcodone -acetaminophen  (NORCO) 5-325 MG tablet   Patient Instructions  Cefalea tensional en los adultos Tension Headache, Adult La cefalea tensional es una sensacin de dolor o presin en la cabeza. Suele sentirse sobre la frente y a los lados de la cabeza. Las cefaleas tensionales pueden durar de 30 minutos a [address]. Cules son las causas? Se desconoce la causa de esta afeccin. A veces, las cefaleas tensionales se producen por el estrs, la preocupacin (ansiedad) o la depresin. Otras cosas que pueden ser causarlas son las siguientes: Alcohol. Exceso de cafena o abstinencia de cafena. Resfros, gripe o infecciones de los senos paranasales. Problemas dentales. Estos pueden arts administrator. Cansancio. Mantener la cabeza y el cuello en la misma posicin durante un perodo prolongado, por ejemplo, al usar la computadora. Fumar. Artritis en el cuello. Cules son los signos o los sntomas? Sensacin de presin alrededor de la cabeza. Un dolor sordo en la cabeza. Dolor sobre la frente y los lados de la cabeza. Dolor a la palpacin en los msculos de la cabeza, del cuello y de los hombros. Cmo se trata? Esta afeccin puede tratarse con cambios en el estilo de  vida y medicamentos que ayudan a paramedic los sntomas. Siga estas instrucciones en su casa: Control del dolor Use los medicamentos de venta libre y los recetados solamente como se lo haya indicado el mdico. Cuando tenga una cefalea, acustese en una habitacin oscura y silenciosa. Si se lo indican, pngase hielo en la cabeza y el cuello. Para hacer esto: Ponga el hielo en una bolsa plstica. Coloque una toalla entre la piel y la bolsa. Aplique el hielo durante 20 minutos, 2 o 3 veces por da. Retire el hielo si la piel se le pone de color rojo brillante. Esto es intel. Si no puede sentir dolor, calor o fro, tiene un mayor riesgo de que se dae la zona. Si se lo indican, aplquese calor en la nuca. Hgalo con la frecuencia que le haya indicado el mdico. Use la fuente de calor que el mdico le recomiende, como una compresa de calor hmedo o una almohadilla trmica. Coloque una toalla entre la piel y la fuente de calor. Aplique calor durante 20 a 30 minutos. Retire la fuente de calor si la piel se le pone de color rojo brillante. Esto es intel. Si no puede sentir dolor, calor o fro, tiene un mayor riesgo de [place]. Comida y bebida Mantenga un horario para las comidas. Si bebe alcohol: Limite la cantidad que bebe a lo siguiente: De 0 a 1 medida por da para las mujeres que no estn embarazadas. De 0 a 2 medidas por da para los hombres. Sepa cunta cantidad de alcohol hay en las bebidas que toma. En los [address], una medida equivale a una botella de cerveza de 12 oz (355 ml), un vaso de vino de 5 oz (148 ml) o un vaso de una bebida alcohlica de alta graduacin de 1 oz (44 ml). Beba suficiente lquido para radio producer pis (orina) de color amarillo plido. No consuma mucha cafena ni deje de consumirla por completo. Estilo de vida [place] entre 7 y 9 horas todas las noches. O duerma la cantidad de sempra energy indique el mdico. A la hora de [place] las  computadoras, los telfonos y las tabletas fuera del dormitorio. Encuentre  maneras de museum/gallery exhibitions officer. Esto puede incluir: Realizar actividad fsica. Respiracin profunda. Yoga. Escuchar msica. Tener pensamientos positivos. Sintese con la espalda recta. Intentar relajar los msculos. No fume ni consuma ningn producto que contenga nicotina o tabaco. Si necesita ayuda para dejar de consumir estos productos, consulte al mdico. Instrucciones generales  Evite las cosas que puedan provocar las cefaleas. Lleve un diario sobre las cefaleas para ver qu puede provocarlas. Registre, por ejemplo, lo siguiente: Lo que usted come y bebe. El tiempo que duerme. Algn cambio en su dieta o en los medicamentos. Cumpla con todas las visitas de seguimiento. Comunquese con un mdico si: La cefalea no se alivia. La cefalea regresa. Tiene una cefalea y citigroup sonidos, la luz o los olores. Tiene ganas de vomitar o vomita. Le duele el estmago. Solicite ayuda de inmediato si: De repente siente un dolor de cabeza muy intenso junto con cualquiera de estas cosas: Rigidez en el cuello. Ganas de vomitar. Vmitos. Sentirse confundido (confuso). Sensacin de debilidad en una parte o un lado del cuerpo. Problemas para ver o hablar, o ambas cosas. Falta de aire. Erupcin cutnea. Sentirse muy somnoliento. Dolor en el ojo o el odo. Dificultad para caminar o mantener el equilibrio. Sensacin de que va a [place], o se desmaya. Resumen La cefalea tensional es un dolor o una presin que se siente en la cabeza. Las cefaleas tensionales pueden durar de 30 minutos a [address]. Los [place] incorporated en el estilo de vida y los medicamentos pueden ayudar a engineer, materials. Esta informacin no tiene theme park manager el consejo del mdico. Asegrese de hacerle al mdico cualquier pregunta que tenga. Document Revised: 05/14/2020 Document Reviewed: 05/14/2020 Elsevier Patient Education  2024 Elsevier  Inc.      Emil Schaumann, MD [facility]

## 2023-11-22 ENCOUNTER — Telehealth: Payer: Self-pay | Admitting: Emergency Medicine

## 2023-11-22 DIAGNOSIS — E039 Hypothyroidism, unspecified: Secondary | ICD-10-CM

## 2023-11-22 DIAGNOSIS — E785 Hyperlipidemia, unspecified: Secondary | ICD-10-CM

## 2023-11-22 NOTE — Telephone Encounter (Signed)
 Copied from CRM [id number]. Topic: Clinical - Medication Refill >> Nov 22, 2023  2:18 PM Marlan Silva wrote: Most Recent Primary Care Visit:  Provider: Elvira Hammersmith  Department: [facility]  Visit Type: ACUTE  Date: 07/29/2023  Medication: atorvastatin  (LIPITOR) 40 MG tablet, levothyroxine  (SYNTHROID ) 200 MCG tablet  Has the patient contacted their pharmacy? Yes (Agent: If no, request that the patient contact the pharmacy for the refill. If patient does not wish to contact the pharmacy document the reason why and proceed with request.) (Agent: If yes, when and what did the pharmacy advise?)  Is this the correct pharmacy for this prescription? Yes If no, delete pharmacy and type the correct one.  This is the patient's preferred pharmacy:   [id number] [address] [place], [place], [place] [address]   Has the prescription been filled recently? Yes  Is the patient out of the medication? No  Has the patient been seen for an appointment in the last year OR does the patient have an upcoming appointment? No  Can we respond through MyChart? No  Agent: Please be advised that Rx refills may take up to 3 business days. We ask that you follow-up with your pharmacy.

## 2023-11-25 ENCOUNTER — Other Ambulatory Visit: Payer: Self-pay

## 2023-11-25 DIAGNOSIS — E785 Hyperlipidemia, unspecified: Secondary | ICD-10-CM

## 2023-11-25 DIAGNOSIS — E039 Hypothyroidism, unspecified: Secondary | ICD-10-CM

## 2023-11-25 MED ORDER — ATORVASTATIN CALCIUM 40 MG PO TABS: 40.0000 mg | ORAL_TABLET | Freq: Every day | ORAL | 3 refills | Status: DC

## 2023-11-25 MED ORDER — LEVOTHYROXINE SODIUM 200 MCG PO TABS: 200.0000 ug | ORAL_TABLET | Freq: Every day | ORAL | 3 refills | Status: DC

## 2023-11-25 NOTE — Telephone Encounter (Signed)
Medication request send to pt pharmacy

## 2023-11-30 ENCOUNTER — Telehealth: Payer: Self-pay | Admitting: Emergency Medicine

## 2023-11-30 ENCOUNTER — Other Ambulatory Visit: Payer: Self-pay | Admitting: Radiology

## 2023-11-30 DIAGNOSIS — E785 Hyperlipidemia, unspecified: Secondary | ICD-10-CM

## 2023-11-30 DIAGNOSIS — E039 Hypothyroidism, unspecified: Secondary | ICD-10-CM

## 2023-11-30 MED ORDER — LEVOTHYROXINE SODIUM 200 MCG PO TABS: 200.0000 ug | ORAL_TABLET | Freq: Every day | ORAL | 3 refills | Status: AC

## 2023-11-30 MED ORDER — ATORVASTATIN CALCIUM 40 MG PO TABS: 40.0000 mg | ORAL_TABLET | Freq: Every day | ORAL | 3 refills | Status: AC

## 2023-11-30 NOTE — Telephone Encounter (Signed)
 Copied from CRM [id number]. Topic: Clinical - Medication Question >> Nov 29, 2023  4:01 PM Allyne Areola wrote: Reason for CRM: Patient is calling because she followed up with her pharmacy and they have not received the prescriptions for atorvastatin  (LIPITOR) 40 MG tablet & levothyroxine  (SYNTHROID ) 200 MCG tablet. I advised it was sent on 11/25/2023 . She would like to know if we can resend or call and give a verbal order. >> Nov 29, 2023  4:31 PM Martinique E wrote: Patient called back regarding this medication and stated she would like it sent to the [facility] at [facility] in [place].

## 2024-04-03 ENCOUNTER — Ambulatory Visit: Admitting: Emergency Medicine

## 2024-04-03 ENCOUNTER — Encounter: Payer: Self-pay | Admitting: Emergency Medicine

## 2024-04-03 VITALS — BP 96/68 | HR 96 | Temp 98.2°F | Ht 64.0 in | Wt 182.0 lb

## 2024-04-03 DIAGNOSIS — Z23 Encounter for immunization: Secondary | ICD-10-CM | POA: Diagnosis not present

## 2024-04-03 DIAGNOSIS — Z13228 Encounter for screening for other metabolic disorders: Secondary | ICD-10-CM

## 2024-04-03 DIAGNOSIS — L989 Disorder of the skin and subcutaneous tissue, unspecified: Secondary | ICD-10-CM

## 2024-04-03 DIAGNOSIS — R7303 Prediabetes: Secondary | ICD-10-CM | POA: Diagnosis not present

## 2024-04-03 DIAGNOSIS — Z0001 Encounter for general adult medical examination with abnormal findings: Secondary | ICD-10-CM | POA: Diagnosis not present

## 2024-04-03 DIAGNOSIS — E039 Hypothyroidism, unspecified: Secondary | ICD-10-CM

## 2024-04-03 DIAGNOSIS — E785 Hyperlipidemia, unspecified: Secondary | ICD-10-CM | POA: Diagnosis not present

## 2024-04-03 DIAGNOSIS — R102 Pelvic and perineal pain: Secondary | ICD-10-CM | POA: Diagnosis not present

## 2024-04-03 DIAGNOSIS — Z1329 Encounter for screening for other suspected endocrine disorder: Secondary | ICD-10-CM

## 2024-04-03 DIAGNOSIS — Z130 Encounter for screening for diseases of the blood and blood-forming organs and certain disorders involving the immune mechanism: Secondary | ICD-10-CM | POA: Diagnosis not present

## 2024-04-03 DIAGNOSIS — G8929 Other chronic pain: Secondary | ICD-10-CM

## 2024-04-03 LAB — LIPID PANEL
Cholesterol: 251 mg/dL — ABNORMAL HIGH (ref 0–200)
HDL: 41 mg/dL (ref >39.00)
NonHDL: 210.34
Total CHOL/HDL Ratio: 6
Triglycerides: 415 mg/dL — ABNORMAL HIGH (ref 0.0–149.0)
VLDL: 83 mg/dL — ABNORMAL HIGH (ref 0.0–40.0)

## 2024-04-03 LAB — CBC WITH DIFFERENTIAL/PLATELET
Basophils Absolute: 0 K/uL (ref 0.0–0.1)
Basophils Relative: 0.3 % (ref 0.0–3.0)
Eosinophils Absolute: 0.1 K/uL (ref 0.0–0.7)
Eosinophils Relative: 1.3 % (ref 0.0–5.0)
HCT: 38.1 % (ref 36.0–46.0)
Hemoglobin: 12.8 g/dL (ref 12.0–15.0)
Lymphocytes Relative: 40.1 % (ref 12.0–46.0)
Lymphs Abs: 3.9 K/uL (ref 0.7–4.0)
MCHC: 33.6 g/dL (ref 30.0–36.0)
MCV: 93.7 fl (ref 78.0–100.0)
Monocytes Absolute: 0.7 K/uL (ref 0.1–1.0)
Monocytes Relative: 7 % (ref 3.0–12.0)
Neutro Abs: 5 K/uL (ref 1.4–7.7)
Neutrophils Relative %: 51.3 % (ref 43.0–77.0)
Platelets: 295 K/uL (ref 150.0–400.0)
RBC: 4.07 Mil/uL (ref 3.87–5.11)
RDW: 12.8 % (ref 11.5–15.5)
WBC: 9.7 K/uL (ref 4.0–10.5)

## 2024-04-03 LAB — COMPREHENSIVE METABOLIC PANEL WITH GFR
ALT: 19 U/L (ref 0–35)
AST: 20 U/L (ref 0–37)
Albumin: 4.2 g/dL (ref 3.5–5.2)
Alkaline Phosphatase: 56 U/L (ref 39–117)
BUN: 15 mg/dL (ref 6–23)
CO2: 26 meq/L (ref 19–32)
Calcium: 9.3 mg/dL (ref 8.4–10.5)
Chloride: 105 meq/L (ref 96–112)
Creatinine, Ser: 0.61 mg/dL (ref 0.40–1.20)
GFR: 97.82 mL/min (ref >60.00)
Glucose, Bld: 94 mg/dL (ref 70–99)
Potassium: 3.6 meq/L (ref 3.5–5.1)
Sodium: 138 meq/L (ref 135–145)
Total Bilirubin: 0.3 mg/dL (ref 0.2–1.2)
Total Protein: 6.9 g/dL (ref 6.0–8.3)

## 2024-04-03 LAB — HEMOGLOBIN A1C: Hgb A1c MFr Bld: 6.2 % (ref 4.6–6.5)

## 2024-04-03 LAB — TSH: TSH: 2.21 u[IU]/mL (ref 0.35–5.50)

## 2024-04-03 NOTE — Progress Notes (Signed)
 Tracey Campbell 58 y.o.   Chief Complaint  Patient presents with   Annual Exam    Patient here for physical. Patient mentions having a fall in the shower last week, she did hit her head but did not see medical attention    HISTORY OF PRESENT ILLNESS: This is a 58 y.o. female here for annual physical and follow-up on chronic medical conditions Slipped and fell in the shower last week.  Hit left side of her head without loss of consciousness.  No neurological symptoms post head injury.  Doing well. Also complaining of chronic pelvic pain.  Requesting GYN referral Requesting dermatology referral for facial lesion No other complaints or medical concerns today.  HPI   Prior to Admission medications   Medication Sig Start Date End Date Taking? Authorizing Provider  atorvastatin  (LIPITOR) 40 MG tablet Take 1 tablet (40 mg total) by mouth daily. 11/30/23  Yes Tehila Sokolow, Emil Schanz, MD  HYDROcodone -acetaminophen  (NORCO) 5-325 MG tablet Take 1 tablet by mouth every 6 (six) hours as needed for moderate pain (pain score 4-6). 07/29/23  Yes Andreyah Natividad, Emil Schanz, MD  levothyroxine  (SYNTHROID ) 200 MCG tablet Take 1 tablet (200 mcg total) by mouth daily. 11/30/23  Yes Keeghan Mcintire, Emil Schanz, MD  Omega-3 Fatty Acids (FISH OIL) 1000 MG CPDR Take by mouth daily.   Yes [provider]    No Known Allergies  Patient Active Problem List   Diagnosis Date Noted   Prediabetes 05/14/2022   Dyslipidemia 05/15/2021   Hypothyroidism 09/28/2018    Past Medical History:  Diagnosis Date   Elevated cholesterol    Thyroid  disease     Past Surgical History:  Procedure Laterality Date   CARDIAC VALVE REPLACEMENT      Social History   Socioeconomic History   Marital status: Married    Spouse name: Not on file   Number of children: Not on file   Years of education: Not on file   Highest education level: 3rd grade  Occupational History   Not on file  Tobacco Use   Smoking status: Never    Smokeless tobacco: Never  Vaping Use   Vaping status: Never Used  Substance and Sexual Activity   Alcohol use: Never   Drug use: Never   Sexual activity: Not Currently    Partners: Male    Birth control/protection: Post-menopausal  Other Topics Concern   Not on file  Social History Narrative   Not on file   Social Drivers of Health   Financial Resource Strain: Medium Risk (03/30/2024)   Overall Financial Resource Strain (CARDIA)    Difficulty of Paying Living Expenses: Somewhat hard  Food Insecurity: Food Insecurity Present (03/30/2024)   Hunger Vital Sign    Worried About Running Out of Food in the Last Year: Sometimes true    Ran Out of Food in the Last Year: Often true  Transportation Needs: No Transportation Needs (03/30/2024)   PRAPARE - Administrator, Civil Service (Medical): No    Lack of Transportation (Non-Medical): No  Physical Activity: Insufficiently Active (03/30/2024)   Exercise Vital Sign    Days of Exercise per Week: 3 days    Minutes of Exercise per Session: 30 min  Stress: No Stress Concern Present (03/30/2024)   [employer] of Occupational Health - Occupational Stress Questionnaire    Feeling of Stress: Not at all  Social Connections: Socially Integrated (03/30/2024)   Social Connection and Isolation Panel    Frequency of Communication with Friends and  Family: More than three times a week    Frequency of Social Gatherings with Friends and Family: Once a week    Attends Religious Services: More than 4 times per year    Active Member of [employer] or Organizations: Yes    Attends Engineer, structural: More than 4 times per year    Marital Status: Married  Catering manager Violence: Not on file    Family History  Problem Relation Age of Onset   Healthy Sister      Review of Systems  Constitutional: Negative.  Negative for chills and fever.  HENT: Negative.  Negative for congestion and sore throat.   Respiratory: Negative.  Negative  for cough and shortness of breath.   Cardiovascular: Negative.  Negative for chest pain and palpitations.  Gastrointestinal:  Negative for abdominal pain, diarrhea, nausea and vomiting.  Genitourinary: Negative.  Negative for dysuria and hematuria.  Skin:  Negative for rash.       Skin lesion of face  Neurological: Negative.  Negative for dizziness and headaches.  All other systems reviewed and are negative.   Today's Vitals   04/03/24 1536  BP: 96/68  Pulse: 96  Temp: 98.2 F (36.8 C)  TempSrc: Oral  SpO2: 95%  Weight: 182 lb (82.6 kg)  Height: 5' 4 (1.626 m)   Body mass index is 31.24 kg/m.   Physical Exam Vitals reviewed.  Constitutional:      Appearance: Normal appearance.  HENT:     Head: Normocephalic.     Right Ear: Tympanic membrane, ear canal and external ear normal.     Left Ear: Tympanic membrane, ear canal and external ear normal.     Mouth/Throat:     Mouth: Mucous membranes are moist.     Pharynx: Oropharynx is clear.  Eyes:     Extraocular Movements: Extraocular movements intact.     Pupils: Pupils are equal, round, and reactive to light.  Cardiovascular:     Rate and Rhythm: Normal rate and regular rhythm.     Pulses: Normal pulses.     Heart sounds: Normal heart sounds.  Pulmonary:     Effort: Pulmonary effort is normal.     Breath sounds: Normal breath sounds.  Abdominal:     Palpations: Abdomen is soft.     Tenderness: There is no abdominal tenderness.  Musculoskeletal:     Cervical back: No tenderness.  Lymphadenopathy:     Cervical: No cervical adenopathy.  Skin:    General: Skin is warm and dry.  Neurological:     General: No focal deficit present.     Mental Status: She is alert and oriented to person, place, and time.  Psychiatric:        Mood and Affect: Mood normal.        Behavior: Behavior normal.      ASSESSMENT & PLAN: Problem List Items Addressed This Visit       Endocrine   Hypothyroidism   Clinically  euthyroid TSH done today Continue levothyroxine  175 mcg daily Will adjust dose accordingly      Relevant Orders   TSH   Comprehensive metabolic panel with GFR     Other   Dyslipidemia   Chronic stable condition Diet and nutrition discussed Continue atorvastatin  40 mg daily Lipid profile done today      Relevant Orders   Comprehensive metabolic panel with GFR   Lipid panel   Prediabetes   Diet and nutrition discussed Cardiovascular risks associated with  diabetes discussed Diet and nutrition discussed Advised to decrease amount of daily carbohydrate intake and daily calories and increase amount of plant-based protein in her diet      Relevant Orders   Comprehensive metabolic panel with GFR   Hemoglobin A1c   Chronic pelvic pain in female   Active and affecting quality of life May have vaginal prolapse of some kind. Recommend gynecology evaluation Referral placed today      Relevant Orders   Ambulatory referral to Gynecology   Other Visit Diagnoses       Encounter for general adult medical examination with abnormal findings    -  Primary   Relevant Orders   CBC with Differential/Platelet   Comprehensive metabolic panel with GFR     Need for vaccination       Relevant Orders   Flu vaccine trivalent PF, 6mos and older(Flulaval,Afluria,Fluarix,Fluzone)     Facial lesion       Relevant Orders   Ambulatory referral to Dermatology     Screening for deficiency anemia       Relevant Orders   CBC with Differential/Platelet     Screening for endocrine, metabolic and immunity disorder       Relevant Orders   Comprehensive metabolic panel with GFR      Modifiable risk factors discussed with patient. Anticipatory guidance according to age provided. The following topics were also discussed: Social Determinants of Health Smoking.  Non-smoker Diet and nutrition Benefits of exercise Cancer screening and review of most recent mammogram report Vaccinations review and  recommendations Cardiovascular risk assessment and need for blood work The 10-year ASCVD risk score (Arnett DK, et al., 2019) is: 2%   Values used to calculate the score:     Age: 13 years     Clincally relevant sex: Female     Is Non-Hispanic African American: No     Diabetic: No     Tobacco smoker: No     Systolic Blood Pressure: 96 mmHg     Is BP treated: No     HDL Cholesterol: 49.1 mg/dL     Total Cholesterol: 224 mg/dL Review of chronic medical conditions and their management Review of all medications Mental health including depression and anxiety Fall and accident prevention  Patient Instructions  Health Maintenance, Female Adopting a healthy lifestyle and getting preventive care are important in promoting health and wellness. Ask your health care provider about: The right schedule for you to have regular tests and exams. Things you can do on your own to prevent diseases and keep yourself healthy. What should I know about diet, weight, and exercise? Eat a healthy diet  Eat a diet that includes plenty of vegetables, fruits, low-fat dairy products, and lean protein. Do not eat a lot of foods that are high in solid fats, added sugars, or sodium. Maintain a healthy weight Body mass index (BMI) is used to identify weight problems. It estimates body fat based on height and weight. Your health care provider can help determine your BMI and help you achieve or maintain a healthy weight. Get regular exercise Get regular exercise. This is one of the most important things you can do for your health. Most adults should: Exercise for at least 150 minutes each week. The exercise should increase your heart rate and make you sweat (moderate-intensity exercise). Do strengthening exercises at least twice a week. This is in addition to the moderate-intensity exercise. Spend less time sitting. Even light physical activity can be beneficial. Watch  cholesterol and blood lipids Have your blood  tested for lipids and cholesterol at 59 years of age, then have this test every 5 years. Have your cholesterol levels checked more often if: Your lipid or cholesterol levels are high. You are older than 59 years of age. You are at high risk for heart disease. What should I know about cancer screening? Depending on your health history and family history, you may need to have cancer screening at various ages. This may include screening for: Breast cancer. Cervical cancer. Colorectal cancer. Skin cancer. Lung cancer. What should I know about heart disease, diabetes, and high blood pressure? Blood pressure and heart disease High blood pressure causes heart disease and increases the risk of stroke. This is more likely to develop in people who have high blood pressure readings or are overweight. Have your blood pressure checked: Every 3-5 years if you are 23-47 years of age. Every year if you are 47 years old or older. Diabetes Have regular diabetes screenings. This checks your fasting blood sugar level. Have the screening done: Once every three years after age 28 if you are at a normal weight and have a low risk for diabetes. More often and at a younger age if you are overweight or have a high risk for diabetes. What should I know about preventing infection? Hepatitis B If you have a higher risk for hepatitis B, you should be screened for this virus. Talk with your health care provider to find out if you are at risk for hepatitis B infection. Hepatitis C Testing is recommended for: Everyone born from [birth year] through 1966. Anyone with known risk factors for hepatitis C. Sexually transmitted infections (STIs) Get screened for STIs, including gonorrhea and chlamydia, if: You are sexually active and are younger than 59 years of age. You are older than 59 years of age and your health care provider tells you that you are at risk for this type of infection. Your sexual activity has changed since  you were last screened, and you are at increased risk for chlamydia or gonorrhea. Ask your health care provider if you are at risk. Ask your health care provider about whether you are at high risk for HIV. Your health care provider may recommend a prescription medicine to help prevent HIV infection. If you choose to take medicine to prevent HIV, you should first get tested for HIV. You should then be tested every 3 months for as long as you are taking the medicine. Pregnancy If you are about to stop having your period (premenopausal) and you may become pregnant, seek counseling before you get pregnant. Take 400 to 800 micrograms (mcg) of folic acid every day if you become pregnant. Ask for birth control (contraception) if you want to prevent pregnancy. Osteoporosis and menopause Osteoporosis is a disease in which the bones lose minerals and strength with aging. This can result in bone fractures. If you are 32 years old or older, or if you are at risk for osteoporosis and fractures, ask your health care provider if you should: Be screened for bone loss. Take a calcium  or vitamin D supplement to lower your risk of fractures. Be given hormone replacement therapy (HRT) to treat symptoms of menopause. Follow these instructions at home: Alcohol use Do not drink alcohol if: Your health care provider tells you not to drink. You are pregnant, may be pregnant, or are planning to become pregnant. If you drink alcohol: Limit how much you have to: 0-1 drink a day.  Know how much alcohol is in your drink. In the [place] one drink equals one 12 oz bottle of beer (355 mL), one 5 oz glass of wine (148 mL), or one 1 oz glass of hard liquor (44 mL). Lifestyle Do not use any products that contain nicotine or tobacco. These products include cigarettes, chewing tobacco, and vaping devices, such as e-cigarettes. If you need help quitting, ask your health care provider. Do not use street drugs. Do not share  needles. Ask your health care provider for help if you need support or information about quitting drugs. General instructions Schedule regular health, dental, and eye exams. Stay current with your vaccines. Tell your health care provider if: You often feel depressed. You have ever been abused or do not feel safe at home. Summary Adopting a healthy lifestyle and getting preventive care are important in promoting health and wellness. Follow your health care provider's instructions about healthy diet, exercising, and getting tested or screened for diseases. Follow your health care provider's instructions on monitoring your cholesterol and blood pressure. This information is not intended to replace advice given to you by your health care provider. Make sure you discuss any questions you have with your health care provider. Document Revised: 11/25/2020 Document Reviewed: 11/25/2020 Elsevier Patient Education  2024 Elsevier Inc.      Emil Schaumann, MD [facility]

## 2024-04-03 NOTE — Assessment & Plan Note (Signed)
 Active and affecting quality of life May have vaginal prolapse of some kind. Recommend gynecology evaluation Referral placed today

## 2024-04-03 NOTE — Assessment & Plan Note (Signed)
 Diet and nutrition discussed Cardiovascular risks associated with diabetes discussed Diet and nutrition discussed Advised to decrease amount of daily carbohydrate intake and daily calories and increase amount of plant-based protein in her diet

## 2024-04-03 NOTE — Assessment & Plan Note (Signed)
 Chronic stable condition Diet and nutrition discussed Continue atorvastatin 40 mg daily Lipid profile done today

## 2024-04-03 NOTE — Assessment & Plan Note (Signed)
 Clinically euthyroid TSH done today Continue levothyroxine 175 mcg daily Will adjust dose accordingly

## 2024-04-03 NOTE — Patient Instructions (Signed)

## 2024-04-04 ENCOUNTER — Ambulatory Visit: Payer: Self-pay | Admitting: Emergency Medicine

## 2024-04-04 ENCOUNTER — Telehealth: Payer: Self-pay | Admitting: Radiology

## 2024-04-04 DIAGNOSIS — E785 Hyperlipidemia, unspecified: Secondary | ICD-10-CM

## 2024-04-04 LAB — LDL CHOLESTEROL, DIRECT: Direct LDL: 154 mg/dL

## 2024-04-04 NOTE — Telephone Encounter (Signed)
 Copied from CRM [id number]. Topic: Clinical - Lab/Test Results >> Apr 04, 2024 11:51 AM Pinkey ORN wrote: Reason for CRM: Lab Results >> Apr 04, 2024 11:52 AM Pinkey ORN wrote: Patient is requesting a call back to discuss lab results.

## 2024-04-05 NOTE — Telephone Encounter (Signed)
 Returned patients call with interpreter. LVM for patient to call back regarding her questions

## 2024-04-10 ENCOUNTER — Encounter: Payer: Self-pay | Admitting: Nurse Practitioner

## 2024-04-10 ENCOUNTER — Ambulatory Visit (INDEPENDENT_AMBULATORY_CARE_PROVIDER_SITE_OTHER): Admitting: Nurse Practitioner

## 2024-04-10 VITALS — BP 110/64 | HR 62 | Ht 63.0 in | Wt 180.0 lb

## 2024-04-10 DIAGNOSIS — Z780 Asymptomatic menopausal state: Secondary | ICD-10-CM | POA: Diagnosis not present

## 2024-04-10 DIAGNOSIS — N393 Stress incontinence (female) (male): Secondary | ICD-10-CM

## 2024-04-10 DIAGNOSIS — Z01419 Encounter for gynecological examination (general) (routine) without abnormal findings: Secondary | ICD-10-CM

## 2024-04-10 DIAGNOSIS — N8110 Cystocele, unspecified: Secondary | ICD-10-CM

## 2024-04-10 DIAGNOSIS — Z1331 Encounter for screening for depression: Secondary | ICD-10-CM | POA: Diagnosis not present

## 2024-04-10 NOTE — Progress Notes (Signed)
 Tracey Campbell 1966/01/20 [id number]   History:  58 y.o. G3P0003 presents for annual exam. Postmenopausal - no HRT, no bleeding. Normal pap and history. Hypothyroidism, HLD managed by PCP. Complains of stress urinary incontinence that is not new for her. Happens with coughing, sneezing and laughing. Small amount of leakage. Does not occur daily.   Gynecologic History No LMP recorded. Patient is postmenopausal.   Contraception/Family planning: post menopausal status Sexually active: No  Health Maintenance Last Pap: 01/20/2023. Results were: normal Last mammogram: 06/18/2022. Results were: normal Last colonoscopy: Never. Negative Cologuard 11/2022 Last Dexa: Not indicated     04/10/2024    2:05 PM  Depression screen PHQ 2/9  Decreased Interest 0  Down, Depressed, Hopeless 0  PHQ - 2 Score 0     Past medical history, past surgical history, family history and social history were all reviewed and documented in the EPIC chart. Married. 3 children in their 30s, 4 grandchildren. Works in Health visitor at [employer].   ROS:  A ROS was performed and pertinent positives and negatives are included.  Exam:  Vitals:   04/10/24 1401  BP: 110/64  Pulse: 62  SpO2: 98%  Weight: 180 lb (81.6 kg)  Height: 5' 3 (1.6 m)    Body mass index is 31.89 kg/m.  General appearance:  Normal Thyroid :  Symmetrical, normal in size, without palpable masses or nodularity. Respiratory  Auscultation:  Clear without wheezing or rhonchi Cardiovascular  Auscultation:  Regular rate, without rubs, murmurs or gallops  Edema/varicosities:  Not grossly evident Abdominal  Soft,nontender, without masses, guarding or rebound.  Liver/spleen:  No organomegaly noted  Hernia:  None appreciated  Skin  Inspection:  Grossly normal Breasts: Examined lying and sitting.   Right: Without masses, retractions, nipple discharge or axillary adenopathy.   Left: Without masses, retractions, nipple discharge or  axillary adenopathy. Pelvic: External genitalia:  no lesions              Urethra:  normal appearing urethra with no masses, tenderness or lesions              Bartholins and Skenes: normal                 Vagina: normal appearing vagina with normal color and discharge, no lesions. Grade 1 cystocele              Cervix: no lesions Bimanual Exam:  Uterus:  no masses or tenderness              Adnexa: no mass, fullness, tenderness              Rectovaginal: Deferred              Anus:  normal, no lesions  Patient informed chaperone available to be present for breast and pelvic exam. Patient has requested no chaperone to be present. Patient has been advised what will be completed during breast and pelvic exam.   Assessment/Plan:  58 y.o. G3P0003 for annual exam.   Well female exam with routine gynecological exam - Education provided on SBEs, importance of preventative screenings, current guidelines, high calcium  diet, regular exercise, and multivitamin daily. Labs with PCP.   Postmenopausal - no HRT, no bleeding.   Baden-Walker grade 1 cystocele - Plan: Ambulatory referral to Physical Therapy  Stress incontinence - Plan: Ambulatory referral to Physical Therapy. this is not new for her and has not worsened. We talked about lifestyle changes and weight loss to help with symptoms.  Recommend pelvic floor PT.   Screening for cervical cancer - Normal Pap history.  Will repeat at 3-year interval per guidelines.  Screening for breast cancer - Normal mammogram history. Overdue and encouraged to schedule. Normal breast exam today.  Screening for colon cancer - Negative cologuard in 2024.   Return in about 1 year (around 04/10/2025) for Annual.    Annabella DELENA Shutter DNP, 2:16 PM 04/10/2024

## 2024-04-11 ENCOUNTER — Telehealth: Payer: Self-pay | Admitting: *Deleted

## 2024-04-11 NOTE — Progress Notes (Signed)
 Care Guide Pharmacy Note  04/11/2024 Name: [name] MRN: [id number] DOB: 10-16-1965  Referred By: Purcell Emil Schanz, MD Reason for referral: Complex Care Management (Outreach to schedule referral with pharmacist )   Tracey Campbell is a 58 y.o. year old female who is a primary care patient of Sagardia, Miguel Jose, MD.  Courtlynn Holloman was referred to the pharmacist for assistance related to: HLD  Successful contact was made with the patient to discuss pharmacy services including being ready for the pharmacist to call at least 5 minutes before the scheduled appointment time and to have medication bottles and any blood pressure readings ready for review. The patient agreed to meet with the pharmacist via telephone visit on 04/24/2024  Thedford Franks, CMA [place]  [facility], [facility] Guide Direct Dial: [phone]  Fax: [phone] Website: [email or web address]

## 2024-04-24 ENCOUNTER — Other Ambulatory Visit (INDEPENDENT_AMBULATORY_CARE_PROVIDER_SITE_OTHER): Admitting: Pharmacist

## 2024-04-24 DIAGNOSIS — E785 Hyperlipidemia, unspecified: Secondary | ICD-10-CM

## 2024-04-24 NOTE — Progress Notes (Unsigned)
   04/24/2024 Name: Israa Caban MRN: [id number] DOB: December 28, 1965  Chief Complaint  Patient presents with   Hyperlipidemia   Medication Management    Iveliz Garay is a 58 y.o. year old female who presented for a telephone visit.   They were referred to the pharmacist by {referredtopharmacy:27270} for assistance in managing {referralreason:27271}.   Taking atorvastatin  40 mg every other day - change to morning Recheck lipid panel - 2 months  Subjective:  Care Team: Primary Care Provider: Purcell Emil Schanz, MD ; Next Scheduled Visit: *** {careteamprovider:27366}  Medication Access/Adherence  Current Pharmacy:  [id number] [place], [place] - [address] AT [facility] [address] [place] [address] Phone: [phone] Fax: [phone]   Patient reports affordability concerns with their medications: {YES/NO:21197} Patient reports access/transportation concerns to their pharmacy: {YES/NO:21197} Patient reports adherence concerns with their medications:  {YES/NO:21197} ***   {Pharmacy S/O Choices:26420}   Objective:  Lab Results  Component Value Date   HGBA1C 6.2 04/03/2024    Lab Results  Component Value Date   CREATININE 0.61 04/03/2024   BUN 15 04/03/2024   NA 138 04/03/2024   K 3.6 04/03/2024   CL 105 04/03/2024   CO2 26 04/03/2024    Lab Results  Component Value Date   CHOL 251 (H) 04/03/2024   HDL 41.00 04/03/2024   LDLCALC 133 (H) 05/17/2023   LDLDIRECT 154.0 04/03/2024   TRIG (H) 04/03/2024    415.0 Triglyceride is over 400; calculations on Lipids are invalid.   CHOLHDL 6 04/03/2024    Medications Reviewed Today     Reviewed by Merceda Lela SAUNDERS, [facility] (Pharmacist) on 04/24/24 at 1640  Med List Status: <None>   Medication Order Taking? Sig Documenting Provider Last Dose Status Informant  atorvastatin  (LIPITOR) 40 MG tablet [id number] Yes Take 1 tablet (40 mg total) by mouth daily.  Sagardia, Miguel Jose, MD  Active   HYDROcodone -acetaminophen  [facility]) 5-325 MG tablet [id number]  Take 1 tablet by mouth every 6 (six) hours as needed for moderate pain (pain score 4-6). Sagardia, Miguel Jose, MD  Active   levothyroxine  (SYNTHROID ) 200 MCG tablet [id number] Yes Take 1 tablet (200 mcg total) by mouth daily. Sagardia, Miguel Jose, MD  Active   Omega-3 Fatty Acids (FISH OIL) 1000 MG CPDR [id number] Yes Take by mouth daily. [provider]  Active               Assessment/Plan:   {Pharmacy A/P Choices:26421}  Follow Up Plan: ***  ***

## 2024-04-25 NOTE — Patient Instructions (Signed)
 It was a pleasure speaking with you today!  Continue atorvastatin  daily. Make sure to take atorvastatin  every day. You can take atorvastatin  in the morning.  Feel free to call with any questions or concerns!  Darrelyn Drum, PharmD, BCPS, CPP Clinical Pharmacist Practitioner [facility] [phone]

## 2024-05-26 ENCOUNTER — Ambulatory Visit: Admitting: Nurse Practitioner

## 2024-06-09 NOTE — Therapy (Unsigned)
 OUTPATIENT PHYSICAL THERAPY FEMALE PELVIC EVALUATION   Patient Name: Tracey Campbell MRN: [id number] DOB:1966/01/17, 58 y.o., female Today's Date: 06/12/2024  END OF SESSION:  PT End of Session - 06/12/24 1622     Visit Number 1    Date for Recertification  12/10/24    Authorization Type BCBS    PT Start Time 1615    PT Stop Time 1655    PT Time Calculation (min) 40 min    Activity Tolerance Patient tolerated treatment well    Behavior During Therapy [facility] for tasks assessed/performed          Past Medical History:  Diagnosis Date   Elevated cholesterol    Thyroid  disease    Past Surgical History:  Procedure Laterality Date   CARDIAC VALVE REPLACEMENT     Patient Active Problem List   Diagnosis Date Noted   Chronic pelvic pain in female 04/03/2024   Prediabetes 05/14/2022   Dyslipidemia 05/15/2021   Hypothyroidism 09/28/2018    PCP: Purcell Dara Schanz, MD  REFERRING PROVIDER: Prentiss Annabella LABOR, NP   REFERRING DIAG:  N81.10 (ICD-10-CM) - Baden-Walker grade 1 cystocele  N39.3 (ICD-10-CM) - Stress incontinence    THERAPY DIAG:  Other lack of coordination  Facial weakness  Rationale for Evaluation and Treatment: Rehabilitation  ONSET DATE: 2022  SUBJECTIVE:                                                                                                                                                                                           SUBJECTIVE STATEMENT: Leak with coughing, sneezing and laughing.  Fluid intake: water  FUNCTIONAL LIMITATIONS: coughing, sneezing and laughing  PERTINENT HISTORY:  Medications for current condition: none Surgeries: see above Other: Hypothyroidism Sexual abuse: No  PAIN:  Are you having pain? No   PRECAUTIONS: None  RED FLAGS: None   WEIGHT BEARING RESTRICTIONS: No  FALLS:  Has patient fallen in last 6 months? Yes. Number of falls in bathtub while reaching for towel and not due to  balance  OCCUPATION: cook   ACTIVITY LEVEL : low level  PLOF: Independent  PATIENT GOALS: reduce the urinary leakage   BOWEL MOVEMENT: Pain with bowel movement: No Type of bowel movement:Type (Bristol Stool Scale) Type, Frequency 2 times per week, and Strain yes Fully empty rectum: No, will sit for 15-20 minutes Leakage: No  Bowel urgency: none Pads: No Fiber supplement/laxative No  URINATION: Pain with urination: No Fully empty bladder: Yes:                                           Post-void dribble: No Stream: Strong Urgency: No Frequency:during the day average                                                        Nocturia: No1   Leakage: Coughing, Sneezing, and Laughing Pads/briefs: Yes: 1 per day  INTERCOURSE:not active   PREGNANCY: Vaginal deliveries 3 Tearing No Episiotomy No   PROLAPSE: Pressure   OBJECTIVE:  Note: Objective measures were completed at Evaluation unless otherwise noted.  DIAGNOSTIC FINDINGS:  Baden-Walker grade 1 cystocele   PATIENT SURVEYS:  PFIQ-7: 28 UIQ-7 38  COGNITION: Overall cognitive status: Within functional limits for tasks assessed     SENSATION: Light touch: Appears intact   FUNCTIONAL TESTS:  Squat:shift hips to go into standing  GAIT: Assistive device utilized: None  POSTURE: rounded shoulders, forward head, and decreased lumbar lordosis   LUMBARAROM/PROM: lumbar ROM is full   LOWER EXTREMITY ROM:  Passive ROM Right eval Left eval  Hip external rotation 70 50   (Blank rows = not tested)  LOWER EXTREMITY MMT:  MMT Right eval Left eval  Hip extension 5/5 5/5  Hip abduction 4/5 3/5  Hip adduction 5/5 4/5   (Blank rows = not tested) PALPATION: Pelvic Alignment: ASIS equal  Abdominal: tenderness located on the lower abdomen; bulge the lower abdomen with contraction  Diastasis: No Distortion: No  Breathing: difficulty with expanding the lower  rib cage and minimal movement Scar tissue: No                External Perineal Exam: not assessed today                             Internal Pelvic Floor: not assessed today  Patient confirms identification and approves PT to assess internal pelvic floor and treatment No due to her having to leave early.    PELVIC MMT:   MMT eval  Vaginal   Internal Anal Sphincter   External Anal Sphincter   Puborectalis   (Blank rows = not tested)        TONE: Not assessed   PROLAPSE: Not assessed  TODAY'S TREATMENT:                                                                                                                              DATE: 06/12/24  EVAL Examination completed, findings reviewed, pt educated  on POC, HEP, and female pelvic floor anatomy, reasoning with pelvic floor assessment internally with pt consent. Pt motivated to participate in PT and agreeable to attempt recommendations.     PATIENT EDUCATION:  Education details: See above Person educated: Patient Education method: Explanation, Demonstration, Tactile cues, Verbal cues, and Handouts Education comprehension: verbalized understanding, returned demonstration, verbal cues required, tactile cues required, and needs further education  HOME EXERCISE PROGRAM: See above  ASSESSMENT:  CLINICAL IMPRESSION: Patient is a 58 y.o. female who was seen today for physical therapy evaluation and treatment for stress incontinence and prolapse. Patient reports she has had this issues for 3 years. She will leak urine with coughing, laughing, and sneezing. Patient reports she will strain to have a bowel movement. She has a bowel movement 2 times per week. She has decreased lower rib mobility. She has tenderness located in the  lower abdomen Patient had to leave early and did not have time for the therapist to assess her strength of the pelvic floor. Patient will benefit from skilled therapy to improve pelvic floor coordination and  strength to reduce leakage and reduce further progression of her prolapse.   OBJECTIVE IMPAIRMENTS: decreased coordination, decreased strength, and postural dysfunction.   ACTIVITY LIMITATIONS: continence and toileting  PARTICIPATION LIMITATIONS: community activity  PERSONAL FACTORS: Time since onset of injury/illness/exacerbation are also affecting patient's functional outcome.   REHAB POTENTIAL: Excellent  CLINICAL DECISION MAKING: Stable/uncomplicated  EVALUATION COMPLEXITY: Low   GOALS: Goals reviewed with patient? Yes  SHORT TERM GOALS: Target date: 07/10/24  Patient able to perform diaphragmatic breathing to elongate her pelvic floor to reduce strain on her prolapse.  Baseline: Goal status: INITIAL  2.  Patient educated in [facility] for reduction of leakage.  Baseline:  Goal status: INITIAL  3.  Patient educated on abdominal massage to assist with less straining with bowel movements.  Baseline:  Goal status: INITIAL  4.  Patient educated on correct ways to have a bowel movement to reduce progression of the prolapse.  Baseline:  Goal status: INITIAL   LONG TERM GOALS: Target date: 12/10/24  Patient independent with advanced HEP for core and pelvic floor strength.  Baseline:  Goal status: INITIAL  2.  Patient able to laugh, cough, and sneeze with minimal to no urinary leakage due to using the [facility].  Baseline:  Goal status: INITIAL  3.  Patient is able to have a bowel movement with 50% less straining.  Baseline:  Goal status: INITIAL  4.  Patient is able to have a bowel movement with feeling a full evacuation 50% of the times.  Baseline:  Goal status: INITIAL   PLAN:  PT FREQUENCY: 1x/week  PT DURATION: 6 months  PLANNED INTERVENTIONS: 97110-Therapeutic exercises, 97530- Therapeutic activity, 97112- Neuromuscular re-education, 97535- Self Care, 02859- Manual therapy, G0283- Electrical stimulation (unattended), [phone] (1-2 muscles), 20561 (3+ muscles)- Dry  Needling, Patient/Family education, Joint mobilization, Spinal mobilization, Cryotherapy, Moist heat, and Biofeedback  PLAN FOR NEXT SESSION: assess the pelvic floor if patient agree; diaphragmatic breathing, lower abdominal contraction; hip abduction strength   Channing Pereyra, PT 06/12/24 5:00 PM

## 2024-06-12 ENCOUNTER — Ambulatory Visit: Attending: Nurse Practitioner | Admitting: Physical Therapy

## 2024-06-12 ENCOUNTER — Encounter: Payer: Self-pay | Admitting: Physical Therapy

## 2024-06-12 ENCOUNTER — Other Ambulatory Visit: Payer: Self-pay

## 2024-06-12 DIAGNOSIS — R278 Other lack of coordination: Secondary | ICD-10-CM | POA: Diagnosis present

## 2024-06-12 DIAGNOSIS — N8110 Cystocele, unspecified: Secondary | ICD-10-CM | POA: Insufficient documentation

## 2024-06-12 DIAGNOSIS — N393 Stress incontinence (female) (male): Secondary | ICD-10-CM | POA: Diagnosis not present

## 2024-06-12 DIAGNOSIS — R29810 Facial weakness: Secondary | ICD-10-CM | POA: Diagnosis present

## 2024-06-20 ENCOUNTER — Ambulatory Visit: Admitting: Physical Therapy

## 2024-07-06 ENCOUNTER — Telehealth: Payer: Self-pay | Admitting: Pharmacist

## 2024-07-06 NOTE — Telephone Encounter (Signed)
 Contacted patient to remind her to come by for fasting walk-in blood work to recheck lipid panel.   Darrelyn Drum, PharmD, BCPS, CPP Clinical Pharmacist Practitioner [facility] at [facility] [phone]

## 2024-07-31 NOTE — Therapy (Incomplete)
 " OUTPATIENT PHYSICAL THERAPY FEMALE PELVIC TREATMENT   Patient Name: Tracey Campbell MRN: [id number] DOB:Sep 03, 1965, 59 y.o., female Today's Date: 07/31/2024  END OF SESSION:    Past Medical History:  Diagnosis Date   Elevated cholesterol    Thyroid  disease    Past Surgical History:  Procedure Laterality Date   CARDIAC VALVE REPLACEMENT     Patient Active Problem List   Diagnosis Date Noted   Chronic pelvic pain in female 04/03/2024   Prediabetes 05/14/2022   Dyslipidemia 05/15/2021   Hypothyroidism 09/28/2018    PCP: Purcell Dara Schanz, MD  REFERRING PROVIDER: Prentiss Annabella LABOR, NP   REFERRING DIAG:  N81.10 (ICD-10-CM) - Baden-Walker grade 1 cystocele  N39.3 (ICD-10-CM) - Stress incontinence    THERAPY DIAG:  No diagnosis found.  Rationale for Evaluation and Treatment: Rehabilitation  ONSET DATE: 2022  SUBJECTIVE:                                                                                                                                                                                           SUBJECTIVE STATEMENT: Leak with coughing, sneezing and laughing.  Fluid intake: water  FUNCTIONAL LIMITATIONS: coughing, sneezing and laughing  PERTINENT HISTORY:  Medications for current condition: none Surgeries: see above Other: Hypothyroidism Sexual abuse: No  PAIN:  Are you having pain? No   PRECAUTIONS: None  RED FLAGS: None   WEIGHT BEARING RESTRICTIONS: No  FALLS:  Has patient fallen in last 6 months? Yes. Number of falls in bathtub while reaching for towel and not due to balance  OCCUPATION: cook   ACTIVITY LEVEL : low level  PLOF: Independent  PATIENT GOALS: reduce the urinary leakage   BOWEL MOVEMENT: Pain with bowel movement: No Type of bowel movement:Type (Bristol Stool Scale) Type, Frequency 2 times per week, and Strain yes Fully empty rectum: No, will sit for 15-20 minutes Leakage: No                                                  Bowel urgency: none Pads: No Fiber supplement/laxative No  URINATION: Pain with urination: No Fully empty bladder: Yes:                                           Post-void dribble: No Stream: Strong Urgency: No Frequency:during the day average  Nocturia: No1   Leakage: Coughing, Sneezing, and Laughing Pads/briefs: Yes: 1 per day  INTERCOURSE:not active   PREGNANCY: Vaginal deliveries 3 Tearing No Episiotomy No   PROLAPSE: Pressure   OBJECTIVE:  Note: Objective measures were completed at Evaluation unless otherwise noted.  DIAGNOSTIC FINDINGS:  Baden-Walker grade 1 cystocele   PATIENT SURVEYS:  PFIQ-7: 28 UIQ-7 38  COGNITION: Overall cognitive status: Within functional limits for tasks assessed     SENSATION: Light touch: Appears intact   FUNCTIONAL TESTS:  Squat:shift hips to go into standing  GAIT: Assistive device utilized: None  POSTURE: rounded shoulders, forward head, and decreased lumbar lordosis   LUMBARAROM/PROM: lumbar ROM is full   LOWER EXTREMITY ROM:  Passive ROM Right eval Left eval  Hip external rotation 70 50   (Blank rows = not tested)  LOWER EXTREMITY MMT:  MMT Right eval Left eval  Hip extension 5/5 5/5  Hip abduction 4/5 3/5  Hip adduction 5/5 4/5   (Blank rows = not tested) PALPATION: Pelvic Alignment: ASIS equal  Abdominal: tenderness located on the lower abdomen; bulge the lower abdomen with contraction  Diastasis: No Distortion: No  Breathing: difficulty with expanding the lower rib cage and minimal movement Scar tissue: No                External Perineal Exam: not assessed today                             Internal Pelvic Floor: not assessed today  Patient confirms identification and approves PT to assess internal pelvic floor and treatment No due to her having to leave early.    PELVIC MMT:   MMT eval  Vaginal   Internal Anal Sphincter    External Anal Sphincter   Puborectalis   (Blank rows = not tested)        TONE: Not assessed   PROLAPSE: Not assessed  TODAY'S TREATMENT:                                                                                                                              DATE: 06/12/24  EVAL Examination completed, findings reviewed, pt educated on POC, HEP, and female pelvic floor anatomy, reasoning with pelvic floor assessment internally with pt consent. Pt motivated to participate in PT and agreeable to attempt recommendations.     PATIENT EDUCATION:  Education details: See above Person educated: Patient Education method: Explanation, Demonstration, Tactile cues, Verbal cues, and Handouts Education comprehension: verbalized understanding, returned demonstration, verbal cues required, tactile cues required, and needs further education  HOME EXERCISE PROGRAM: See above  ASSESSMENT:  CLINICAL IMPRESSION: Patient is a 59 y.o. female who was seen today for physical therapy evaluation and treatment for stress incontinence and prolapse. Patient reports she has had this issues for 3 years. She will leak urine with coughing, laughing, and sneezing. Patient reports she will strain to have  a bowel movement. She has a bowel movement 2 times per week. She has decreased lower rib mobility. She has tenderness located in the  lower abdomen Patient had to leave early and did not have time for the therapist to assess her strength of the pelvic floor. Patient will benefit from skilled therapy to improve pelvic floor coordination and strength to reduce leakage and reduce further progression of her prolapse.   OBJECTIVE IMPAIRMENTS: decreased coordination, decreased strength, and postural dysfunction.   ACTIVITY LIMITATIONS: continence and toileting  PARTICIPATION LIMITATIONS: community activity  PERSONAL FACTORS: Time since onset of injury/illness/exacerbation are also affecting patient's functional  outcome.   REHAB POTENTIAL: Excellent  CLINICAL DECISION MAKING: Stable/uncomplicated  EVALUATION COMPLEXITY: Low   GOALS: Goals reviewed with patient? Yes  SHORT TERM GOALS: Target date: 07/10/24  Patient able to perform diaphragmatic breathing to elongate her pelvic floor to reduce strain on her prolapse.  Baseline: Goal status: INITIAL  2.  Patient educated in [facility] for reduction of leakage.  Baseline:  Goal status: INITIAL  3.  Patient educated on abdominal massage to assist with less straining with bowel movements.  Baseline:  Goal status: INITIAL  4.  Patient educated on correct ways to have a bowel movement to reduce progression of the prolapse.  Baseline:  Goal status: INITIAL   LONG TERM GOALS: Target date: 12/10/24  Patient independent with advanced HEP for core and pelvic floor strength.  Baseline:  Goal status: INITIAL  2.  Patient able to laugh, cough, and sneeze with minimal to no urinary leakage due to using the [facility].  Baseline:  Goal status: INITIAL  3.  Patient is able to have a bowel movement with 50% less straining.  Baseline:  Goal status: INITIAL  4.  Patient is able to have a bowel movement with feeling a full evacuation 50% of the times.  Baseline:  Goal status: INITIAL   PLAN:  PT FREQUENCY: 1x/week  PT DURATION: 6 months  PLANNED INTERVENTIONS: 97110-Therapeutic exercises, 97530- Therapeutic activity, 97112- Neuromuscular re-education, [phone]- Self Care, 02859- Manual therapy, G0283- Electrical stimulation (unattended), [phone] (1-2 muscles), 20561 (3+ muscles)- Dry Needling, Patient/Family education, Joint mobilization, Spinal mobilization, Cryotherapy, Moist heat, and Biofeedback  PLAN FOR NEXT SESSION: assess the pelvic floor if patient agree; diaphragmatic breathing, lower abdominal contraction; hip abduction strength   Ilyanna Baillargeon, PT  07/31/2024 4:27 PM   "

## 2024-08-01 ENCOUNTER — Ambulatory Visit: Attending: Nurse Practitioner | Admitting: Physical Therapy

## 2024-08-01 ENCOUNTER — Telehealth: Payer: Self-pay | Admitting: Physical Therapy

## 2024-08-01 DIAGNOSIS — R29810 Facial weakness: Secondary | ICD-10-CM | POA: Insufficient documentation

## 2024-08-01 DIAGNOSIS — R278 Other lack of coordination: Secondary | ICD-10-CM | POA: Insufficient documentation

## 2024-08-01 NOTE — Telephone Encounter (Signed)
 Left patient a VM re no show for PT appt this morning. Patient reported that she rescheduled it to 1/19 due to work conflict

## 2024-08-02 ENCOUNTER — Encounter: Payer: Self-pay | Admitting: Dermatology

## 2024-08-02 ENCOUNTER — Ambulatory Visit: Admitting: Dermatology

## 2024-08-02 VITALS — BP 115/75 | HR 69

## 2024-08-02 DIAGNOSIS — D2239 Melanocytic nevi of other parts of face: Secondary | ICD-10-CM

## 2024-08-02 DIAGNOSIS — D485 Neoplasm of uncertain behavior of skin: Secondary | ICD-10-CM

## 2024-08-02 DIAGNOSIS — B079 Viral wart, unspecified: Secondary | ICD-10-CM | POA: Diagnosis not present

## 2024-08-02 NOTE — Patient Instructions (Addendum)

## 2024-08-02 NOTE — Progress Notes (Signed)
 "  New Patient Visit   Subjective  Tracey Campbell is a 59 y.o. female who presents for the following: Spot (s)  Patient states she has spots located at the face, Nose and Right Wrist that she would like to have examined. Patient reports the areas have been there for 5 years. She reports the areas are not bothersome.She reports that the areas have gotten bigger in size spread. Patient reports she has not previously been treated for these areas. Patient denies Hx of bx. Patient denies family history of skin cancer(s).  The patient has spots, moles and lesions to be evaluated, some may be new or changing and the patient may have concern these could be cancer.  Patient provided verbal consent for the use of an AI-assisted program to generate a detailed after-visit summary. The patient understands that the AI tool is used to support clinical documentation and that all information will be reviewed and verified by the healthcare provider.  The following portions of the chart were reviewed this encounter and updated as appropriate: medications, allergies, medical history  Review of Systems:  No other skin or systemic complaints except as noted in HPI or Assessment and Plan.  Objective  Well appearing patient in no apparent distress; mood and affect are within normal limits.  A focused examination was performed of the following areas: Right wrist, nose and chin  Relevant exam findings are noted in the Assessment and Plan.                      Right Chin 6 mm tan papule Right Wrist - Anterior 6 mm verrucas plaque  Assessment & Plan   Neoplasm of uncertain behavior of skin Two lesions present for approximately five years: a 6 mm tan papule on the chin and an 8 mm lesion on the wrist. The chin lesion is benign and will not be removed to avoid nasal damage. The wrist lesion requires biopsy to differentiate between verruca and ISK. - Performed shave removal of the 6 mm tan papule on the  chin. - Performed biopsy of the 8 mm lesion on the wrist to differentiate between verruca and ISK. - Sent biopsy samples to the lab for analysis. - Instructed to apply Aquaphor to the treated areas. - Will communicate lab results via MyChart next week. NEOPLASM OF UNCERTAIN BEHAVIOR OF SKIN (2) Right Chin - Epidermal / dermal shaving  Lesion diameter (cm):  0.6 Informed consent: discussed and consent obtained   Timeout: patient name, date of birth, surgical site, and procedure verified   Procedure prep:  Patient was prepped and draped in usual sterile fashion Prep type:  Isopropyl alcohol Anesthesia: the lesion was anesthetized in a standard fashion   Anesthetic:  1% lidocaine w/ epinephrine 1-100,000 buffered w/ 8.4% NaHCO3 Instrument used: flexible razor blade   Hemostasis achieved with: pressure, aluminum chloride and electrodesiccation   Outcome: patient tolerated procedure well   Post-procedure details: sterile dressing applied and wound care instructions given   Dressing type: bandage and petrolatum    Specimen A - Surgical pathology Differential Diagnosis: R/O DN  Check Margins: No Right Wrist - Anterior - Skin / nail biopsy Type of biopsy: tangential   Informed consent: discussed and consent obtained   Timeout: patient name, date of birth, surgical site, and procedure verified   Procedure prep:  Patient was prepped and draped in usual sterile fashion Prep type:  Isopropyl alcohol Anesthesia: the lesion was anesthetized in a standard fashion  Anesthetic:  1% lidocaine w/ epinephrine 1-100,000 buffered w/ 8.4% NaHCO3 Instrument used: DermaBlade   Hemostasis achieved with: aluminum chloride   Outcome: patient tolerated procedure well   Post-procedure details: sterile dressing applied and wound care instructions given   Dressing type: petrolatum gauze and bandage    Specimen B - Surgical pathology Differential Diagnosis: R/O Verruca vs ISK vs NMSC  Check Margins:  No  Return if symptoms worsen or fail to improve.  I, Jetta Ager, am acting as neurosurgeon for [employer], DO.  Documentation: I have reviewed the above documentation for accuracy and completeness, and I agree with the above.  Delon Lenis, DO     "

## 2024-08-03 ENCOUNTER — Ambulatory Visit: Payer: Self-pay | Admitting: Dermatology

## 2024-08-03 LAB — SURGICAL PATHOLOGY

## 2024-08-07 ENCOUNTER — Ambulatory Visit: Admitting: Physical Therapy

## 2024-08-07 ENCOUNTER — Encounter: Payer: Self-pay | Admitting: Physical Therapy

## 2024-08-07 ENCOUNTER — Telehealth: Payer: Self-pay | Admitting: Physical Therapy

## 2024-08-07 DIAGNOSIS — R29810 Facial weakness: Secondary | ICD-10-CM

## 2024-08-07 DIAGNOSIS — R278 Other lack of coordination: Secondary | ICD-10-CM

## 2024-08-07 NOTE — Telephone Encounter (Signed)
 Called patient and left a message but came for appointment at 4:29 PM Tracey Campbell, PT @1 /19/26@ 4:29 PM

## 2024-08-07 NOTE — Patient Instructions (Signed)
 How To Poop Better:  What are Good Poops? There is no one exact normal, but they should be REGULAR.  This varies from person to person and ranges from up to 3x/day or as little as 3-4/week.  This should stay consistent for you.  They should be formed and ideally one solid mass that doesn't fall apart or dissolve in the water and is Reitz in color.  Lifestyle Tips:  Fiber: Eat 25-31 grams per day Do not hold it.  If you need to go, GO! Try to go every day around the same time Walk and move more Probiotics for more healthy gut bacteria Water and fluids: half of your healthy body weight in ounces  Toileting Tips:  Posture: knees above hips, back flat, look straight ahead, RELAX Relax all the muscles from your face down to your toes Breathe: slow deep breaths into your belly and pelvic floor is RELAXED Blow: Tighten belly and blow like blowing up a balloon, make "SH" sound, make a vowel sound with a deep voice Do NOT sit more than 10 minutes After you are finished, tighten the muscles to reset pelvic floor back to normal      [facility] [address] [place], [place] [address] Phone # [phone] Fax [phone]

## 2024-08-07 NOTE — Therapy (Signed)
 " OUTPATIENT PHYSICAL THERAPY FEMALE PELVIC TREATMENT   Patient Name: Tracey Campbell MRN: [id number] DOB:[birth year], 59 y.o., female Today's Date: 08/07/2024  END OF SESSION:  PT End of Session - 08/07/24 1631     Visit Number 2    Date for Recertification  12/10/24    Authorization Type [employer]    PT Start Time 1630    PT Stop Time 1700    PT Time Calculation (min) 30 min    Activity Tolerance Patient tolerated treatment well    Behavior During Therapy [facility] for tasks assessed/performed          Past Medical History:  Diagnosis Date   Elevated cholesterol    Thyroid  disease    Past Surgical History:  Procedure Laterality Date   CARDIAC VALVE REPLACEMENT     Patient Active Problem List   Diagnosis Date Noted   Chronic pelvic pain in female 04/03/2024   Prediabetes 05/14/2022   Dyslipidemia 05/15/2021   Hypothyroidism 09/28/2018    PCP: Purcell Dara Schanz, MD  REFERRING PROVIDER: Prentiss Annabella LABOR, NP   REFERRING DIAG:  N81.10 (ICD-10-CM) - Baden-Walker grade 1 cystocele  N39.3 (ICD-10-CM) - Stress incontinence    THERAPY DIAG:  Other lack of coordination  Facial weakness  Rationale for Evaluation and Treatment: Rehabilitation  ONSET DATE: 2022  SUBJECTIVE:                                                                                                                                                                                           SUBJECTIVE STATEMENT: Felt alright from the initial evaluation. I am not leaking urine now. I am doing the exercises.  Fluid intake: water  FUNCTIONAL LIMITATIONS: coughing, sneezing and laughing  PERTINENT HISTORY:  Medications for current condition: none Surgeries: see above Other: Hypothyroidism Sexual abuse: No  PAIN:  Are you having pain? No   PRECAUTIONS: None  RED FLAGS: None   WEIGHT BEARING RESTRICTIONS: No  FALLS:  Has patient fallen in last 6 months? Yes. Number of falls in bathtub while  reaching for towel and not due to balance  OCCUPATION: cook   ACTIVITY LEVEL : low level  PLOF: Independent  PATIENT GOALS: reduce the urinary leakage   BOWEL MOVEMENT: Pain with bowel movement: No Type of bowel movement:Type (Bristol Stool Scale) Type, Frequency 2 times per week, and Strain yes Fully empty rectum: No, will sit for 15-20 minutes Leakage: No  Bowel urgency: none Pads: No Fiber supplement/laxative No  URINATION: Pain with urination: No Fully empty bladder: Yes:                                           Post-void dribble: No Stream: Strong Urgency: No Frequency:during the day average                                                        Nocturia: No1   Leakage: Coughing, Sneezing, and Laughing Pads/briefs: Yes: 1 per day  INTERCOURSE:not active   PREGNANCY: Vaginal deliveries 3 Tearing No Episiotomy No   PROLAPSE: Pressure   OBJECTIVE:  Note: Objective measures were completed at Evaluation unless otherwise noted.  DIAGNOSTIC FINDINGS:  Baden-Walker grade 1 cystocele   PATIENT SURVEYS:  PFIQ-7: 28 UIQ-7 38  COGNITION: Overall cognitive status: Within functional limits for tasks assessed     SENSATION: Light touch: Appears intact   FUNCTIONAL TESTS:  Squat:shift hips to go into standing  GAIT: Assistive device utilized: None  POSTURE: rounded shoulders, forward head, and decreased lumbar lordosis   LUMBARAROM/PROM: lumbar ROM is full   LOWER EXTREMITY ROM:  Passive ROM Right eval Left eval  Hip external rotation 70 50   (Blank rows = not tested)  LOWER EXTREMITY MMT:  MMT Right eval Left eval  Hip extension 5/5 5/5  Hip abduction 4/5 3/5  Hip adduction 5/5 4/5   (Blank rows = not tested) PALPATION: Pelvic Alignment: ASIS equal  Abdominal: tenderness located on the lower abdomen; bulge the lower abdomen with contraction  Diastasis: No Distortion: No  Breathing:  difficulty with expanding the lower rib cage and minimal movement Scar tissue: No                External Perineal Exam: not assessed today                             Internal Pelvic Floor: not assessed today  Patient confirms identification and approves PT to assess internal pelvic floor and treatment No due to her having to leave early.    PELVIC MMT:   MMT eval  Vaginal   Internal Anal Sphincter   External Anal Sphincter   Puborectalis   (Blank rows = not tested)        TONE: Not assessed   PROLAPSE: Not assessed  TODAY'S TREATMENT:   08/07/24 Manual: Soft tissue mobilization: Circular abdominal massage to promote peristalic motion of intestines Manual work to the diaphragm for diaphragmatic breathing Myofascial release: Fascial release to the lower abdomen to reduce the fascial restrictions and assist with easier stools Exercises: Stretches/mobility: Happy baby stretch in sitting Piriformis stretch in sitting holding 30 sec each Strengthening: Bridge 15 x Sitting pelvic floor contraction holding 10 sec 10 x Sitting KNACK 5 x  Laying on side hip adduction 8 times each and was hard for her to do  DATE: 06/12/24  EVAL Examination completed, findings reviewed, pt educated on POC, HEP, and female pelvic floor anatomy, reasoning with pelvic floor assessment internally with pt consent. Pt motivated to participate in PT and agreeable to attempt recommendations.     PATIENT EDUCATION:  08/07/24 Education details: Access Code: [email or web address] Person educated: Patient Education method: Explanation, Demonstration, Tactile cues, Verbal cues, and Handouts Education comprehension: verbalized understanding, returned demonstration, verbal cues required, tactile cues required, and needs further education  HOME EXERCISE PROGRAM: 08/07/24 Access Code:  [email or web address] URL: [email or web address] Date: 08/07/2024 Prepared by: Channing Pereyra  Exercises - Supine Diaphragmatic Breathing  - 2 x daily - 7 x weekly - 1 sets - 10 reps - Seated Diaphragmatic Breathing  - 2 x daily - 7 x weekly - 1 sets - 10 reps - Seated Piriformis Stretch with Trunk Bend  - 1 x daily - 7 x weekly - 1 sets - 2 reps - 30 sec hold - Seated Happy Baby With Trunk Flexion For Pelvic Relaxation  - 1 x daily - 7 x weekly - 1 sets - 1 reps - 30 sec hold - Seated Pelvic Floor Contraction  - 3 x daily - 7 x weekly - 1 sets - 10 reps - 10 sec hold - Seated Quick Flick Pelvic Floor Contractions  - 3 x daily - 7 x weekly - 1 sets - 5 reps - Supine Bridge with Pelvic Floor Contraction  - 1 x daily - 7 x weekly - 1 sets - 15 reps - Sidelying Hip Adduction  - 1 x daily - 7 x weekly - 2 sets - 10 reps  Patient Education - Abdominal Massage for Constipation  ASSESSMENT:  CLINICAL IMPRESSION: Patient is a 59 y.o. female who was seen today for physical therapy  treatment for stress incontinence and prolapse. Patient reports she is not having urinary leakage. She is still straining to have a bowel movement.  She has learned how to sit on the commode and breath to relax the pelvic floor. She had trouble with the hip adductors due to weakness. Patient will benefit from skilled therapy to improve pelvic floor coordination and strength to reduce leakage and reduce further progression of her prolapse.   OBJECTIVE IMPAIRMENTS: decreased coordination, decreased strength, and postural dysfunction.   ACTIVITY LIMITATIONS: continence and toileting  PARTICIPATION LIMITATIONS: community activity  PERSONAL FACTORS: Time since onset of injury/illness/exacerbation are also affecting patient's functional outcome.   REHAB POTENTIAL: Excellent  CLINICAL DECISION MAKING: Stable/uncomplicated  EVALUATION COMPLEXITY: Low   GOALS: Goals reviewed with patient? Yes  SHORT TERM GOALS: Target  date: 07/10/24  Patient able to perform diaphragmatic breathing to elongate her pelvic floor to reduce strain on her prolapse.  Baseline: Goal status: Met 08/07/24  2.  Patient educated in [facility] for reduction of leakage.  Baseline:  Goal status: 08/07/24  3.  Patient educated on abdominal massage to assist with less straining with bowel movements.  Baseline:  Goal status: 08/07/24  4.  Patient educated on correct ways to have a bowel movement to reduce progression of the prolapse.  Baseline:  Goal status: INITIAL   LONG TERM GOALS: Target date: 12/10/24  Patient independent with advanced HEP for core and pelvic floor strength.  Baseline:  Goal status: INITIAL  2.  Patient able to laugh, cough, and sneeze with minimal to no urinary leakage due to using the [facility].  Baseline:  Goal status: INITIAL  3.  Patient is able to have a bowel movement with 50%  less straining.  Baseline:  Goal status: INITIAL  4.  Patient is able to have a bowel movement with feeling a full evacuation 50% of the times.  Baseline:  Goal status: INITIAL   PLAN:  PT FREQUENCY: 1x/week  PT DURATION: 6 months  PLANNED INTERVENTIONS: 97110-Therapeutic exercises, 97530- Therapeutic activity, 97112- Neuromuscular re-education, 97535- Self Care, [id number]- Manual therapy, [id number]- Electrical stimulation (unattended), [phone] (1-2 muscles), [id number] (3+ muscles)- Dry Needling, Patient/Family education, Joint mobilization, Spinal mobilization, Cryotherapy, Moist heat, and Biofeedback  PLAN FOR NEXT SESSION:  diaphragmatic breathing, lower abdominal contraction; hip abduction strength; review past HEP   Channing Pereyra, PT 08/07/24 5:10 PM   "

## 2024-08-14 ENCOUNTER — Ambulatory Visit: Admitting: Physical Therapy

## 2024-09-06 ENCOUNTER — Ambulatory Visit: Admitting: Physical Therapy
